# Patient Record
Sex: Male | Born: 1980 | Race: White | Hispanic: No | Marital: Married | Smoking: Former smoker
Health system: Southern US, Community
[De-identification: ages and names within clinical notes are randomized; demographics above are authoritative.]

## PROBLEM LIST (undated history)

## (undated) DIAGNOSIS — N189 Chronic kidney disease, unspecified: Secondary | ICD-10-CM

## (undated) DIAGNOSIS — I429 Cardiomyopathy, unspecified: Secondary | ICD-10-CM

## (undated) DIAGNOSIS — I5042 Chronic combined systolic (congestive) and diastolic (congestive) heart failure: Secondary | ICD-10-CM

## (undated) HISTORY — PX: NO PAST SURGERIES: SHX2092

---

## 2020-04-29 DIAGNOSIS — I10 Essential (primary) hypertension: Secondary | ICD-10-CM

## 2020-04-29 HISTORY — DX: Essential (primary) hypertension: I10

## 2020-05-11 ENCOUNTER — Other Ambulatory Visit: Payer: Self-pay

## 2020-05-11 ENCOUNTER — Encounter (HOSPITAL_BASED_OUTPATIENT_CLINIC_OR_DEPARTMENT_OTHER): Payer: Self-pay

## 2020-05-11 ENCOUNTER — Encounter: Payer: Self-pay | Admitting: Emergency Medicine

## 2020-05-11 ENCOUNTER — Ambulatory Visit
Admission: EM | Admit: 2020-05-11 | Discharge: 2020-05-11 | Disposition: A | Discharge status: Home / Self Care | Payer: BC Managed Care – PPO | Attending: Emergency Medicine | Admitting: Emergency Medicine

## 2020-05-11 ENCOUNTER — Ambulatory Visit (INDEPENDENT_AMBULATORY_CARE_PROVIDER_SITE_OTHER): Payer: BC Managed Care – PPO

## 2020-05-11 ENCOUNTER — Inpatient Hospital Stay (HOSPITAL_BASED_OUTPATIENT_CLINIC_OR_DEPARTMENT_OTHER)
Admission: EM | Admit: 2020-05-11 | Discharge: 2020-05-14 | DRG: 304 | Disposition: A | Discharge status: Home / Self Care | Payer: BC Managed Care – PPO | Attending: Internal Medicine | Admitting: Internal Medicine

## 2020-05-11 DIAGNOSIS — Z6833 Body mass index (BMI) 33.0-33.9, adult: Secondary | ICD-10-CM

## 2020-05-11 DIAGNOSIS — I248 Other forms of acute ischemic heart disease: Secondary | ICD-10-CM | POA: Diagnosis present

## 2020-05-11 DIAGNOSIS — Z79899 Other long term (current) drug therapy: Secondary | ICD-10-CM

## 2020-05-11 DIAGNOSIS — E785 Hyperlipidemia, unspecified: Secondary | ICD-10-CM | POA: Diagnosis present

## 2020-05-11 DIAGNOSIS — I5043 Acute on chronic combined systolic (congestive) and diastolic (congestive) heart failure: Secondary | ICD-10-CM | POA: Diagnosis present

## 2020-05-11 DIAGNOSIS — R9431 Abnormal electrocardiogram [ECG] [EKG]: Secondary | ICD-10-CM

## 2020-05-11 DIAGNOSIS — I43 Cardiomyopathy in diseases classified elsewhere: Secondary | ICD-10-CM | POA: Diagnosis present

## 2020-05-11 DIAGNOSIS — I130 Hypertensive heart and chronic kidney disease with heart failure and stage 1 through stage 4 chronic kidney disease, or unspecified chronic kidney disease: Secondary | ICD-10-CM | POA: Diagnosis present

## 2020-05-11 DIAGNOSIS — I509 Heart failure, unspecified: Secondary | ICD-10-CM

## 2020-05-11 DIAGNOSIS — R0981 Nasal congestion: Secondary | ICD-10-CM

## 2020-05-11 DIAGNOSIS — R05 Cough: Secondary | ICD-10-CM

## 2020-05-11 DIAGNOSIS — E876 Hypokalemia: Secondary | ICD-10-CM | POA: Diagnosis present

## 2020-05-11 DIAGNOSIS — I161 Hypertensive emergency: Principal | ICD-10-CM | POA: Diagnosis present

## 2020-05-11 DIAGNOSIS — I169 Hypertensive crisis, unspecified: Secondary | ICD-10-CM | POA: Diagnosis present

## 2020-05-11 DIAGNOSIS — Z87891 Personal history of nicotine dependence: Secondary | ICD-10-CM

## 2020-05-11 DIAGNOSIS — Z20822 Contact with and (suspected) exposure to covid-19: Secondary | ICD-10-CM | POA: Diagnosis present

## 2020-05-11 DIAGNOSIS — R0600 Dyspnea, unspecified: Secondary | ICD-10-CM | POA: Diagnosis not present

## 2020-05-11 DIAGNOSIS — N19 Unspecified kidney failure: Secondary | ICD-10-CM

## 2020-05-11 DIAGNOSIS — R059 Cough, unspecified: Secondary | ICD-10-CM

## 2020-05-11 DIAGNOSIS — R0602 Shortness of breath: Secondary | ICD-10-CM

## 2020-05-11 DIAGNOSIS — N179 Acute kidney failure, unspecified: Secondary | ICD-10-CM | POA: Diagnosis present

## 2020-05-11 DIAGNOSIS — N1832 Chronic kidney disease, stage 3b: Secondary | ICD-10-CM | POA: Diagnosis present

## 2020-05-11 HISTORY — DX: Chronic combined systolic (congestive) and diastolic (congestive) heart failure: I50.42

## 2020-05-11 HISTORY — DX: Chronic kidney disease, unspecified: N18.9

## 2020-05-11 HISTORY — DX: Cardiomyopathy, unspecified: I42.9

## 2020-05-11 LAB — BASIC METABOLIC PANEL
Anion gap: 11 (ref 5–15)
BUN: 30 mg/dL — ABNORMAL HIGH (ref 6–20)
CO2: 24 mmol/L (ref 22–32)
Calcium: 8.9 mg/dL (ref 8.9–10.3)
Chloride: 102 mmol/L (ref 98–111)
Creatinine, Ser: 2.34 mg/dL — ABNORMAL HIGH (ref 0.61–1.24)
GFR calc Af Amer: 39 mL/min — ABNORMAL LOW (ref >60)
GFR calc non Af Amer: 34 mL/min — ABNORMAL LOW (ref >60)
Glucose, Bld: 102 mg/dL — ABNORMAL HIGH (ref 70–99)
Potassium: 3.4 mmol/L — ABNORMAL LOW (ref 3.5–5.1)
Sodium: 137 mmol/L (ref 135–145)

## 2020-05-11 LAB — CBC WITH DIFFERENTIAL/PLATELET
Abs Immature Granulocytes: 0.06 10*3/uL (ref 0.00–0.07)
Basophils Absolute: 0.1 10*3/uL (ref 0.0–0.1)
Basophils Relative: 1 %
Eosinophils Absolute: 0.4 10*3/uL (ref 0.0–0.5)
Eosinophils Relative: 4 %
HCT: 42.3 % (ref 39.0–52.0)
Hemoglobin: 15 g/dL (ref 13.0–17.0)
Immature Granulocytes: 1 %
Lymphocytes Relative: 21 %
Lymphs Abs: 1.9 10*3/uL (ref 0.7–4.0)
MCH: 32.4 pg (ref 26.0–34.0)
MCHC: 35.5 g/dL (ref 30.0–36.0)
MCV: 91.4 fL (ref 80.0–100.0)
Monocytes Absolute: 0.6 10*3/uL (ref 0.1–1.0)
Monocytes Relative: 7 %
Neutro Abs: 5.8 10*3/uL (ref 1.7–7.7)
Neutrophils Relative %: 66 %
Platelets: 102 10*3/uL — ABNORMAL LOW (ref 150–400)
RBC: 4.63 MIL/uL (ref 4.22–5.81)
RDW: 14.9 % (ref 11.5–15.5)
WBC: 8.8 10*3/uL (ref 4.0–10.5)
nRBC: 0 % (ref 0.0–0.2)

## 2020-05-11 LAB — BRAIN NATRIURETIC PEPTIDE: B Natriuretic Peptide: 396.7 pg/mL — ABNORMAL HIGH (ref 0.0–100.0)

## 2020-05-11 LAB — SARS CORONAVIRUS 2 BY RT PCR (HOSPITAL ORDER, PERFORMED IN ~~LOC~~ HOSPITAL LAB): SARS Coronavirus 2: NEGATIVE

## 2020-05-11 MED ORDER — HYDRALAZINE HCL 25 MG PO TABS
25.0000 mg | ORAL_TABLET | Freq: Once | ORAL | Status: AC
  Administered 2020-05-11: 25 mg via ORAL
  Filled 2020-05-11: qty 1

## 2020-05-11 MED ORDER — AMLODIPINE BESYLATE 5 MG PO TABS
10.0000 mg | ORAL_TABLET | Freq: Once | ORAL | Status: AC
  Administered 2020-05-11: 10 mg via ORAL
  Filled 2020-05-11: qty 2

## 2020-05-11 MED ORDER — LABETALOL HCL 5 MG/ML IV SOLN
10.0000 mg | Freq: Once | INTRAVENOUS | Status: AC
  Administered 2020-05-11: 10 mg via INTRAVENOUS
  Filled 2020-05-11: qty 4

## 2020-05-11 NOTE — ED Triage Notes (Addendum)
Nasal congestion x 2 weeks, OTC meds not helping. Fully vaccinated.

## 2020-05-11 NOTE — ED Notes (Signed)
Case discussed with EDP Lynelle Doctor after his review of EKG-orders received

## 2020-05-11 NOTE — ED Triage Notes (Signed)
Pt was seen at [facility] for flu sx x 2 weeks-was sent to ED for "high BP and abnormal EKG"-pt denies pain-states a covid test was done and is pending-NAD-steady gait

## 2020-05-11 NOTE — ED Provider Notes (Signed)
[facility]    CSN: [id number] Arrival date & time: 05/11/20  1306      History   Chief Complaint Chief Complaint  Patient presents with  . Nasal Congestion    HPI Sean Charles is a 39 y.o. male  Subjective:   Sean Charles is a 39 y.o. male here for evaluation of a cough.  The cough is non-productive, with shortness of breath during the cough and is aggravated by nothing. Onset of symptoms was 2 weeks ago, unchanged since that time.  Associated symptoms include nasal congestion. Patient does not have a history of asthma. Patient has not had recent travel. Patient does not have a history of smoking. Patient  has not had a previous chest x-ray. Patient has not had a PPD done.  Taking decongestants w/o relief.   BP elevated: no CP, leg swelling, nausea, facial droop, numbness/weakness.  No known h/o HTN.  The following portions of the patient's history were reviewed and updated as appropriate: allergies, current medications, past family history, past medical history, past social history, past surgical history and problem list.     History reviewed. No pertinent past medical history.  There are no problems to display for this patient.   History reviewed. No pertinent surgical history.     Home Medications    Prior to Admission medications   Not on File    Family History History reviewed. No pertinent family history.  Social History Social History   Tobacco Use  . Smoking status: Never Smoker  . Smokeless tobacco: Never Used  Vaping Use  . Vaping Use: Never used  Substance Use Topics  . Alcohol use: Not on file  . Drug use: Not on file     Allergies   Patient has no known allergies.   Review of Systems As per HPI   Physical Exam Triage Vital Signs ED Triage Vitals [05/11/20 1311]  Enc Vitals Group     BP      Pulse Rate (!) 106     Resp 16     Temp 98.9 F (37.2 C)     Temp src      SpO2 99 %     Weight      Height      Head  Circumference      Peak Flow      Pain Score      Pain Loc      Pain Edu?      Excl. in GC?    No data found.  Updated Vital Signs BP (!) 223/148   Pulse (!) 106   Temp 98.9 F (37.2 C)   Resp 16   SpO2 99%   Visual Acuity Right Eye Distance:   Left Eye Distance:   Bilateral Distance:    Right Eye Near:   Left Eye Near:    Bilateral Near:     Physical Exam Constitutional:      General: He is not in acute distress.    Appearance: He is not toxic-appearing or diaphoretic.  HENT:     Head: Normocephalic and atraumatic.     Right Ear: Tympanic membrane, ear canal and external ear normal.     Left Ear: Tympanic membrane, ear canal and external ear normal.     Nose: Nose normal.     Mouth/Throat:     Mouth: Mucous membranes are moist.     Pharynx: Oropharynx is clear.  Eyes:     General: No scleral icterus.  Conjunctiva/sclera: Conjunctivae normal.     Pupils: Pupils are equal, round, and reactive to light.  Neck:     Comments: Trachea midline, negative JVD Cardiovascular:     Rate and Rhythm: Regular rhythm. Tachycardia present.     Comments: BP 240/161 at bedside Pulmonary:     Effort: Pulmonary effort is normal. No respiratory distress.     Breath sounds: No wheezing, rhonchi or rales.  Musculoskeletal:     Cervical back: Neck supple. No tenderness.  Lymphadenopathy:     Cervical: No cervical adenopathy.  Skin:    Capillary Refill: Capillary refill takes less than 2 seconds.     Coloration: Skin is not jaundiced or pale.     Findings: No rash.  Neurological:     Mental Status: He is alert and oriented to person, place, and time.      UC Treatments / Results  Labs (all labs ordered are listed, but only abnormal results are displayed) Labs Reviewed  NOVEL CORONAVIRUS, NAA    EKG   Radiology DG Chest 2 View  Result Date: 05/11/2020 CLINICAL DATA:  Cough. Nasal congestion. Shortness of breath. Two weeks duration. EXAM: CHEST - 2 VIEW COMPARISON:   None. FINDINGS: Heart size upper limits of normal. Mildly tortuous aorta. No consolidation, collapse or effusion. Possible bronchial thickening suggesting bronchitis. No significant bone finding. IMPRESSION: Possible bronchitis pattern. No consolidation or collapse. Electronically Signed   By: Paulina Fusi M.D.   On: 05/11/2020 13:49    Procedures Procedures (including critical care time)  Medications Ordered in UC Medications - No data to display  Initial Impression / Assessment and Plan / UC Course  I have reviewed the triage vital signs and the nursing notes.  Pertinent labs & imaging results that were available during my care of the patient were reviewed by me and considered in my medical decision making (see chart for details).     Patient afebrile, nontoxic, with SpO2 99%.  EKG done in office, reviewed by me w/o previous to compare.  Sinus tachycardia with ventricular at 104 bpm.  Patient does have QTC prolongation with left ventricular hypertrophy and repolarization abnormality.  CXR with possible bronchitis pattern and heart size upper limit of normal.  No consolidation or collapse.  Covid PCR pending.  Decongestants which could contribute to BP elevation.  Given patient's significantly elevated blood pressure, abnormal EKG, and lack of access to routine outpatient health care, patient referred to ER for further evaluation/management of hypertensive emergency.  Patient electing to transport to ER in personal vehicle. Final Clinical Impressions(s) / UC Diagnoses   Final diagnoses:  Cough  Nasal congestion  Hypertensive emergency  Dyspnea, unspecified type  Abnormal EKG   Discharge Instructions   None    ED Prescriptions    None     PDMP not reviewed this encounter.   Hall-Potvin, [place], [place] 05/11/20 1417

## 2020-05-11 NOTE — ED Provider Notes (Signed)
[facility] EMERGENCY DEPARTMENT Provider Note   CSN: [id number] Arrival date & time: 05/11/20  1436     History Chief Complaint  Patient presents with  . Hypertension    Sean Charles is a 39 y.o. male.  HPI     2wk ago coughing a lot, dyspnea Felt better for about a week then began to feel badly again on Tuesday, increasing cough and shortness of breath. Not coughing anything up.  Still feeling some short of breath, sitting up seems to help, tries to sleep more propped up, breaths better. Dyspnea. No leg swelling. Waking up feeling dyspnea at night.  No chest pain Denies numbness, weakness, difficulty talking or walking, visual changes or facial droop.    Feels like chest congestion, but not having runny nose/sinus congestion/sore throat, no sick contacts at home  Fatigue over the last week  Started taking a sleep aid Working from home today and went to get checked out for the coughing  Checked blood pressure and found it to be elevated at urgent care, had CXR which showed enlarged heart   No known history of hypertension, does not see physician regularly  Family hx-CAD in family, uncle, mom had some htn   Does not take any medications  Used to smoke, occ etoh once/day, no other drugs  Takes ibuprofen-2-3times/week, robitussen over last few weeks   History reviewed. No pertinent past medical history.  Patient Active Problem List   Diagnosis Date Noted  . AKI (acute kidney injury) (HCC) 05/12/2020  . Acute on chronic congestive heart failure (HCC) 05/12/2020  . Hypertensive crisis 05/11/2020    History reviewed. No pertinent surgical history.     Family History  Problem Relation Age of Onset  . Heart disease Maternal Uncle     Social History   Tobacco Use  . Smoking status: Former Smoker    Quit date: 2016    Years since quitting: 5.6  . Smokeless tobacco: Never Used  Vaping Use  . Vaping Use: Never used  Substance Use Topics  .  Alcohol use: Yes    Alcohol/week: 7.0 standard drinks    Types: 7 Cans of beer per week    Comment: daily  . Drug use: Never    Home Medications Prior to Admission medications   Not on File    Allergies    Patient has no known allergies.  Review of Systems   Review of Systems  Constitutional: Positive for fatigue. Negative for fever.  HENT: Negative for congestion and sore throat.   Eyes: Negative for visual disturbance.  Respiratory: Positive for cough and shortness of breath.   Cardiovascular: Negative for chest pain.  Gastrointestinal: Negative for abdominal pain, nausea and vomiting.  Genitourinary: Negative for difficulty urinating.  Musculoskeletal: Negative for back pain, neck pain and neck stiffness.  Skin: Negative for rash.  Neurological: Negative for dizziness, syncope, facial asymmetry, weakness, numbness and headaches.    Physical Exam Updated Vital Signs BP (!) 179/131 (BP Location: Left Arm)   Pulse 92   Temp 97.8 F (36.6 C) (Skin)   Resp 19   Ht 5\' 10"  (1.778 m)   Wt 107 kg   SpO2 98%   BMI 33.85 kg/m   Physical Exam Vitals and nursing note reviewed.  Constitutional:      General: He is not in acute distress.    Appearance: He is well-developed. He is not diaphoretic.  HENT:     Head: Normocephalic and atraumatic.  Eyes:  Conjunctiva/sclera: Conjunctivae normal.  Cardiovascular:     Rate and Rhythm: Normal rate and regular rhythm.     Heart sounds: Normal heart sounds. No murmur heard.  No friction rub. No gallop.   Pulmonary:     Effort: Pulmonary effort is normal. No respiratory distress.     Breath sounds: Normal breath sounds. No wheezing or rales.  Abdominal:     General: There is no distension.     Palpations: Abdomen is soft.     Tenderness: There is no abdominal tenderness. There is no guarding.  Musculoskeletal:     Cervical back: Normal range of motion.  Skin:    General: Skin is warm and dry.  Neurological:     Mental  Status: He is alert and oriented to person, place, and time.     ED Results / Procedures / Treatments   Labs (all labs ordered are listed, but only abnormal results are displayed) Labs Reviewed  CBC WITH DIFFERENTIAL/PLATELET - Abnormal; Notable for the following components:      Result Value   Platelets 102 (*)    All other components within normal limits  BASIC METABOLIC PANEL - Abnormal; Notable for the following components:   Potassium 3.4 (*)    Glucose, Bld 102 (*)    BUN 30 (*)    Creatinine, Ser 2.34 (*)    GFR calc non Af Amer 34 (*)    GFR calc Af Amer 39 (*)    All other components within normal limits  BRAIN NATRIURETIC PEPTIDE - Abnormal; Notable for the following components:   B Natriuretic Peptide 396.7 (*)    All other components within normal limits  URINALYSIS, COMPLETE (UACMP) WITH MICROSCOPIC - Abnormal; Notable for the following components:   Color, Urine STRAW (*)    Hgb urine dipstick SMALL (*)    Protein, ur 30 (*)    All other components within normal limits  COMPREHENSIVE METABOLIC PANEL - Abnormal; Notable for the following components:   Potassium 2.8 (*)    Glucose, Bld 119 (*)    BUN 23 (*)    Creatinine, Ser 2.33 (*)    Total Protein 6.4 (*)    ALT 87 (*)    Total Bilirubin 1.5 (*)    GFR calc non Af Amer 34 (*)    GFR calc Af Amer 39 (*)    All other components within normal limits  CBC WITH DIFFERENTIAL/PLATELET - Abnormal; Notable for the following components:   MCHC 36.2 (*)    Platelets 97 (*)    All other components within normal limits  TROPONIN I (HIGH SENSITIVITY) - Abnormal; Notable for the following components:   Troponin I (High Sensitivity) 38 (*)    All other components within normal limits  SARS CORONAVIRUS 2 BY RT PCR (HOSPITAL ORDER, PERFORMED IN [facility])  SODIUM, URINE, RANDOM  CREATININE, URINE, RANDOM  MAGNESIUM  HIV ANTIBODY (ROUTINE TESTING W REFLEX)  UREA NITROGEN, URINE     EKG None  Radiology DG Chest 2 View  Result Date: 05/11/2020 CLINICAL DATA:  Cough. Nasal congestion. Shortness of breath. Two weeks duration. EXAM: CHEST - 2 VIEW COMPARISON:  None. FINDINGS: Heart size upper limits of normal. Mildly tortuous aorta. No consolidation, collapse or effusion. Possible bronchial thickening suggesting bronchitis. No significant bone finding. IMPRESSION: Possible bronchitis pattern. No consolidation or collapse. Electronically Signed   By: Paulina Fusi M.D.   On: 05/11/2020 13:49   US RENAL  Result Date: 05/12/2020  CLINICAL DATA:  Initial evaluation for acute renal failure. EXAM: RENAL / URINARY TRACT ULTRASOUND COMPLETE COMPARISON:  None. FINDINGS: Right Kidney: Renal measurements: 12.3 x 4.2 x 4.4 cm = volume: 119.3 mL. Renal echogenicity within normal limits. No nephrolithiasis or hydronephrosis. No focal renal mass. Left Kidney: Renal measurements: 12.5 x 4.6 x 4.7 cm = volume: 142.0 mL. Renal echogenicity within normal limits. No nephrolithiasis or hydronephrosis. No focal renal mass. Bladder: Appears normal for degree of bladder distention. Other: None. IMPRESSION: Normal renal ultrasound. No hydronephrosis or other acute abnormality. Electronically Signed   By: Rise Mu M.D.   On: 05/12/2020 07:02    Procedures Procedures (including critical care time)  Medications Ordered in ED Medications  hydrALAZINE (APRESOLINE) injection 10 mg (10 mg Intravenous Given 05/12/20 0832)  carvedilol (COREG) tablet 6.25 mg (6.25 mg Oral Given 05/12/20 0829)  furosemide (LASIX) injection 40 mg (40 mg Intravenous Given 05/12/20 0831)  sodium chloride flush (NS) 0.9 % injection 3 mL (3 mLs Intravenous Given 05/12/20 0836)  sodium chloride flush (NS) 0.9 % injection 3 mL (has no administration in time range)  0.9 %  sodium chloride infusion (has no administration in time range)  acetaminophen (TYLENOL) tablet 650 mg (has no administration in time range)   ondansetron (ZOFRAN) injection 4 mg (has no administration in time range)  enoxaparin (LOVENOX) injection 40 mg (40 mg Subcutaneous Given 05/12/20 0830)  polyethylene glycol (MIRALAX / GLYCOLAX) packet 17 g (has no administration in time range)  amLODipine (NORVASC) tablet 10 mg (10 mg Oral Given 05/12/20 0829)  potassium chloride SA (KLOR-CON) CR tablet 40 mEq (40 mEq Oral Given 05/12/20 0826)  hydrALAZINE (APRESOLINE) tablet 25 mg (has no administration in time range)  amLODipine (NORVASC) tablet 10 mg (10 mg Oral Given 05/11/20 1539)  hydrALAZINE (APRESOLINE) tablet 25 mg (25 mg Oral Given 05/11/20 1750)  labetalol (NORMODYNE) injection 10 mg (10 mg Intravenous Given 05/11/20 2042)    ED Course  I have reviewed the triage vital signs and the nursing notes.  Pertinent labs & imaging results that were available during my care of the patient were reviewed by me and considered in my medical decision making (see chart for details).    MDM Rules/Calculators/A&P                          39yo male with no known medical history presents with concern for dyspnea, cough and hypertension found at urgent care. CXR and EKG done at urgent care show no pulmonary edema, heart upper limits of normal in size, EKG with LVH.  Blood pressures 223/148.  Reports cough, congestion, no signs of significant CHF on XR but concern for element of this given symptoms and severe hypertension.  Creatinine 2.3 with no known prior, unclear acuity. Suspect renal injury secondary to hypertensive urgency/emergency.  Given oral medication without significant change in blood pressures. Given IV labetalol with and will admit for renal failure and hypertensive emergency.    Final Clinical Impression(s) / ED Diagnoses Final diagnoses:  Hypertensive emergency  Acute renal failure, unspecified acute renal failure type [facility])    Rx / DC Orders ED Discharge Orders    None       Alvira Monday, MD 05/12/20 1008

## 2020-05-11 NOTE — Care Management (Addendum)
Received consult for assistance with finding a PCP patient has been to Ugent Care at [facility], will send information to [facility] for follow up. Will have Clinic CM follow up with patient,all information placed on AVS.

## 2020-05-11 NOTE — ED Notes (Signed)
Patient is being discharged from the Urgent Care and sent to the Emergency Department via POV after declining EMS . Per [place], [place], patient is in need of higher level of care due to hypertension. Patient is aware and verbalizes understanding of plan of care.  Vitals:   05/11/20 1311 05/11/20 1315  BP:  (!) 223/148  Pulse: (!) 106   Resp: 16   Temp: 98.9 F (37.2 C)   SpO2: 99%

## 2020-05-12 ENCOUNTER — Inpatient Hospital Stay (HOSPITAL_COMMUNITY): Payer: BC Managed Care – PPO

## 2020-05-12 ENCOUNTER — Encounter (HOSPITAL_COMMUNITY): Payer: Self-pay | Admitting: Family Medicine

## 2020-05-12 ENCOUNTER — Observation Stay (HOSPITAL_COMMUNITY): Payer: BC Managed Care – PPO

## 2020-05-12 DIAGNOSIS — N179 Acute kidney failure, unspecified: Secondary | ICD-10-CM | POA: Diagnosis present

## 2020-05-12 DIAGNOSIS — I509 Heart failure, unspecified: Secondary | ICD-10-CM | POA: Diagnosis not present

## 2020-05-12 DIAGNOSIS — N1832 Chronic kidney disease, stage 3b: Secondary | ICD-10-CM | POA: Diagnosis present

## 2020-05-12 DIAGNOSIS — Z6833 Body mass index (BMI) 33.0-33.9, adult: Secondary | ICD-10-CM | POA: Diagnosis not present

## 2020-05-12 DIAGNOSIS — I160 Hypertensive urgency: Secondary | ICD-10-CM | POA: Diagnosis not present

## 2020-05-12 DIAGNOSIS — Z20822 Contact with and (suspected) exposure to covid-19: Secondary | ICD-10-CM | POA: Diagnosis present

## 2020-05-12 DIAGNOSIS — I248 Other forms of acute ischemic heart disease: Secondary | ICD-10-CM | POA: Diagnosis present

## 2020-05-12 DIAGNOSIS — I5021 Acute systolic (congestive) heart failure: Secondary | ICD-10-CM | POA: Diagnosis not present

## 2020-05-12 DIAGNOSIS — E876 Hypokalemia: Secondary | ICD-10-CM | POA: Diagnosis present

## 2020-05-12 DIAGNOSIS — E785 Hyperlipidemia, unspecified: Secondary | ICD-10-CM | POA: Diagnosis present

## 2020-05-12 DIAGNOSIS — Z79899 Other long term (current) drug therapy: Secondary | ICD-10-CM | POA: Diagnosis not present

## 2020-05-12 DIAGNOSIS — Z87891 Personal history of nicotine dependence: Secondary | ICD-10-CM | POA: Diagnosis not present

## 2020-05-12 DIAGNOSIS — I161 Hypertensive emergency: Secondary | ICD-10-CM | POA: Diagnosis present

## 2020-05-12 DIAGNOSIS — I5041 Acute combined systolic (congestive) and diastolic (congestive) heart failure: Secondary | ICD-10-CM | POA: Diagnosis not present

## 2020-05-12 DIAGNOSIS — I130 Hypertensive heart and chronic kidney disease with heart failure and stage 1 through stage 4 chronic kidney disease, or unspecified chronic kidney disease: Secondary | ICD-10-CM | POA: Diagnosis present

## 2020-05-12 DIAGNOSIS — I5043 Acute on chronic combined systolic (congestive) and diastolic (congestive) heart failure: Secondary | ICD-10-CM | POA: Diagnosis present

## 2020-05-12 DIAGNOSIS — I43 Cardiomyopathy in diseases classified elsewhere: Secondary | ICD-10-CM | POA: Diagnosis present

## 2020-05-12 DIAGNOSIS — I169 Hypertensive crisis, unspecified: Secondary | ICD-10-CM | POA: Diagnosis not present

## 2020-05-12 LAB — CBC WITH DIFFERENTIAL/PLATELET
Abs Immature Granulocytes: 0.06 10*3/uL (ref 0.00–0.07)
Basophils Absolute: 0.1 10*3/uL (ref 0.0–0.1)
Basophils Relative: 1 %
Eosinophils Absolute: 0.3 10*3/uL (ref 0.0–0.5)
Eosinophils Relative: 4 %
HCT: 42 % (ref 39.0–52.0)
Hemoglobin: 15.2 g/dL (ref 13.0–17.0)
Immature Granulocytes: 1 %
Lymphocytes Relative: 23 %
Lymphs Abs: 2.1 10*3/uL (ref 0.7–4.0)
MCH: 32.5 pg (ref 26.0–34.0)
MCHC: 36.2 g/dL — ABNORMAL HIGH (ref 30.0–36.0)
MCV: 89.7 fL (ref 80.0–100.0)
Monocytes Absolute: 0.7 10*3/uL (ref 0.1–1.0)
Monocytes Relative: 7 %
Neutro Abs: 6.1 10*3/uL (ref 1.7–7.7)
Neutrophils Relative %: 64 %
Platelets: 97 10*3/uL — ABNORMAL LOW (ref 150–400)
RBC: 4.68 MIL/uL (ref 4.22–5.81)
RDW: 14.6 % (ref 11.5–15.5)
WBC: 9.4 10*3/uL (ref 4.0–10.5)
nRBC: 0 % (ref 0.0–0.2)

## 2020-05-12 LAB — COMPREHENSIVE METABOLIC PANEL
ALT: 87 U/L — ABNORMAL HIGH (ref 0–44)
AST: 40 U/L (ref 15–41)
Albumin: 3.5 g/dL (ref 3.5–5.0)
Alkaline Phosphatase: 67 U/L (ref 38–126)
Anion gap: 11 (ref 5–15)
BUN: 23 mg/dL — ABNORMAL HIGH (ref 6–20)
CO2: 23 mmol/L (ref 22–32)
Calcium: 8.9 mg/dL (ref 8.9–10.3)
Chloride: 101 mmol/L (ref 98–111)
Creatinine, Ser: 2.33 mg/dL — ABNORMAL HIGH (ref 0.61–1.24)
GFR calc Af Amer: 39 mL/min — ABNORMAL LOW (ref >60)
GFR calc non Af Amer: 34 mL/min — ABNORMAL LOW (ref >60)
Glucose, Bld: 119 mg/dL — ABNORMAL HIGH (ref 70–99)
Potassium: 2.8 mmol/L — ABNORMAL LOW (ref 3.5–5.1)
Sodium: 135 mmol/L (ref 135–145)
Total Bilirubin: 1.5 mg/dL — ABNORMAL HIGH (ref 0.3–1.2)
Total Protein: 6.4 g/dL — ABNORMAL LOW (ref 6.5–8.1)

## 2020-05-12 LAB — ECHOCARDIOGRAM COMPLETE
Area-P 1/2: 5.5 cm2
Calc EF: 46.7 %
Height: 70 in
S' Lateral: 3.9 cm
Single Plane A2C EF: 47.9 %
Single Plane A4C EF: 46.6 %
Weight: 3774.28 oz

## 2020-05-12 LAB — URINALYSIS, COMPLETE (UACMP) WITH MICROSCOPIC
Bacteria, UA: NONE SEEN
Bilirubin Urine: NEGATIVE
Glucose, UA: NEGATIVE mg/dL
Ketones, ur: NEGATIVE mg/dL
Leukocytes,Ua: NEGATIVE
Nitrite: NEGATIVE
Protein, ur: 30 mg/dL — AB
Specific Gravity, Urine: 1.009 (ref 1.005–1.030)
pH: 7 (ref 5.0–8.0)

## 2020-05-12 LAB — HIV ANTIBODY (ROUTINE TESTING W REFLEX): HIV Screen 4th Generation wRfx: NONREACTIVE

## 2020-05-12 LAB — TROPONIN I (HIGH SENSITIVITY)
Troponin I (High Sensitivity): 38 ng/L — ABNORMAL HIGH (ref <18)
Troponin I (High Sensitivity): 36 ng/L — ABNORMAL HIGH (ref <18)

## 2020-05-12 LAB — CREATININE, URINE, RANDOM: Creatinine, Urine: 45 mg/dL

## 2020-05-12 LAB — NOVEL CORONAVIRUS, NAA: SARS-CoV-2, NAA: NOT DETECTED

## 2020-05-12 LAB — SODIUM, URINE, RANDOM: Sodium, Ur: 140 mmol/L

## 2020-05-12 LAB — SARS-COV-2, NAA 2 DAY TAT

## 2020-05-12 LAB — MAGNESIUM: Magnesium: 2.1 mg/dL (ref 1.7–2.4)

## 2020-05-12 MED ORDER — FUROSEMIDE 10 MG/ML IJ SOLN
40.0000 mg | Freq: Two times a day (BID) | INTRAMUSCULAR | Status: DC
  Administered 2020-05-12 (×3): 40 mg via INTRAVENOUS
  Filled 2020-05-12 (×3): qty 4

## 2020-05-12 MED ORDER — CARVEDILOL 6.25 MG PO TABS
6.2500 mg | ORAL_TABLET | Freq: Two times a day (BID) | ORAL | Status: DC
  Administered 2020-05-12 (×2): 6.25 mg via ORAL
  Filled 2020-05-12 (×2): qty 1

## 2020-05-12 MED ORDER — HYDRALAZINE HCL 25 MG PO TABS: 25.0000 mg | ORAL_TABLET | Freq: Three times a day (TID) | ORAL | Status: DC

## 2020-05-12 MED ORDER — HYDRALAZINE HCL 25 MG PO TABS
25.0000 mg | ORAL_TABLET | Freq: Three times a day (TID) | ORAL | Status: DC
  Administered 2020-05-12 – 2020-05-13 (×2): 25 mg via ORAL
  Filled 2020-05-12 (×2): qty 1

## 2020-05-12 MED ORDER — AMLODIPINE BESYLATE 10 MG PO TABS
10.0000 mg | ORAL_TABLET | Freq: Every day | ORAL | Status: DC
  Administered 2020-05-12 – 2020-05-14 (×3): 10 mg via ORAL
  Filled 2020-05-12 (×3): qty 1

## 2020-05-12 MED ORDER — CARVEDILOL 6.25 MG PO TABS: 6.2500 mg | ORAL_TABLET | Freq: Two times a day (BID) | ORAL | Status: DC

## 2020-05-12 MED ORDER — SODIUM CHLORIDE 0.9 % IV SOLN: 250.0000 mL | INTRAVENOUS | Status: DC | PRN

## 2020-05-12 MED ORDER — POLYETHYLENE GLYCOL 3350 17 G PO PACK: 17.0000 g | PACK | Freq: Every day | ORAL | Status: DC | PRN

## 2020-05-12 MED ORDER — ONDANSETRON HCL 4 MG/2ML IJ SOLN
4.0000 mg | Freq: Four times a day (QID) | INTRAMUSCULAR | Status: DC | PRN
  Administered 2020-05-12: 4 mg via INTRAVENOUS
  Filled 2020-05-12: qty 2

## 2020-05-12 MED ORDER — ENOXAPARIN SODIUM 40 MG/0.4ML ~~LOC~~ SOLN
40.0000 mg | Freq: Every day | SUBCUTANEOUS | Status: DC
  Administered 2020-05-12 – 2020-05-14 (×2): 40 mg via SUBCUTANEOUS
  Filled 2020-05-12 (×3): qty 0.4

## 2020-05-12 MED ORDER — SODIUM CHLORIDE 0.9% FLUSH: 3.0000 mL | INTRAVENOUS | Status: DC | PRN

## 2020-05-12 MED ORDER — POTASSIUM CHLORIDE CRYS ER 20 MEQ PO TBCR
40.0000 meq | EXTENDED_RELEASE_TABLET | ORAL | Status: AC
  Administered 2020-05-12 (×3): 40 meq via ORAL
  Filled 2020-05-12 (×3): qty 2

## 2020-05-12 MED ORDER — MELATONIN 3 MG PO TABS
3.0000 mg | ORAL_TABLET | Freq: Every evening | ORAL | Status: DC | PRN
  Administered 2020-05-13 (×2): 3 mg via ORAL
  Filled 2020-05-12 (×2): qty 1

## 2020-05-12 MED ORDER — SODIUM CHLORIDE 0.9% FLUSH
3.0000 mL | Freq: Two times a day (BID) | INTRAVENOUS | Status: DC
  Administered 2020-05-12 – 2020-05-14 (×5): 3 mL via INTRAVENOUS

## 2020-05-12 MED ORDER — ACETAMINOPHEN 325 MG PO TABS
650.0000 mg | ORAL_TABLET | ORAL | Status: DC | PRN
  Administered 2020-05-12 – 2020-05-13 (×3): 650 mg via ORAL
  Filled 2020-05-12 (×3): qty 2

## 2020-05-12 MED ORDER — HYDRALAZINE HCL 20 MG/ML IJ SOLN
10.0000 mg | Freq: Four times a day (QID) | INTRAMUSCULAR | Status: DC | PRN
  Administered 2020-05-12 – 2020-05-13 (×3): 10 mg via INTRAVENOUS
  Filled 2020-05-12 (×4): qty 1

## 2020-05-12 MED ORDER — CARVEDILOL 12.5 MG PO TABS
12.5000 mg | ORAL_TABLET | Freq: Two times a day (BID) | ORAL | Status: DC
  Administered 2020-05-13: 12.5 mg via ORAL
  Filled 2020-05-12: qty 1

## 2020-05-12 NOTE — H&P (Signed)
History and Physical    Sean Charles HYW:[id number] DOB: 07/23/1981 DOA: 05/11/2020  PCP: Patient, No Pcp Per  [facility]    Chief Complaint:   Cough, SOB  HPI:    39 year old male without any past medical history who presents to med [facility] emergency department with cough and exertional dyspnea.  Patient explains that approximately 1 month ago he noticed that he was beginning to develop a degree of exertional dyspnea.  He particularly noticed this when he was walking to the store or walking his dog where he would suddenly become short of breath where he was not short of breath before.  Shortness of breath continue to gradually worsening over the following weeks.  Approximately 2 weeks ago the patient began to develop "congestion" with a dry nonproductive cough.  This was associated with episodes of paroxysmal nocturnal dyspnea and pillow orthopnea.  Over the same span of time the patient felt that he was gaining weight but is unable to quantify how much.  Patient denies any associated leg swelling, chest pain, fever, increasing abdominal girth, sick contacts or contacts with confirmed COVID-19 infection.  Of note, patient has received his COVID-19 vaccine in early 2021.  Patient dyspnea on exertion and cough continued to worsen until he eventually presented to [facility] for evaluation.  Upon evaluation at [facility] the patient was found to have markedly elevated blood pressures as high as 229/167 on arrival.  Patient was provided a dose of amlodipine as well as intravenous labetalol.  Patient was also found to have an elevated creatinine of 2.34 as well as an elevated BNP of 396.  Patient is felt to be suffering from hypertensive crisis and therefore the hospitalist group has been called to accept the patient as a transfer to the progressive medical floor at [facility] for admission.  Review of Systems:   Review of  Systems  Constitutional: Positive for malaise/fatigue.  Respiratory: Positive for cough and shortness of breath.   Cardiovascular: Positive for PND.  All other systems reviewed and are negative.     History reviewed. No pertinent past medical history.  History reviewed. No pertinent surgical history.   reports that he quit smoking about 5 years ago. He has never used smokeless tobacco. He reports current alcohol use of about 7.0 standard drinks of alcohol per week. He reports that he does not use drugs.  No Known Allergies  Family History  Problem Relation Age of Onset  . Heart disease Maternal Uncle      Prior to Admission medications   Not on File    Physical Exam: Vitals:   05/11/20 2200 05/11/20 2342 05/12/20 0012 05/12/20 0211  BP: (!) 190/133 (!) 212/140 (!) 187/134 (!) 178/130  Pulse: 88 99 96   Resp: (!) 34 (!) 25 (!) 21 15  Temp:  98.6 F (37 C)    TempSrc:  Oral    SpO2: 97% 98% 97%   Weight:  107.6 kg    Height:        Constitutional: Acute alert and oriented x3, no associated distress.  Patient is obese. Skin: no rashes, no lesions, good skin turgor noted. Eyes: Pupils are equally reactive to light.  No evidence of scleral icterus or conjunctival pallor.  ENMT: Moist mucous membranes noted.  Posterior pharynx clear of any exudate or lesions.   Neck: normal, supple, no masses, no thyromegaly.  No evidence of jugular venous distension.  Respiratory: Bibasilar rales noted, clear to auscultation bilaterally, no wheezing,  Normal respiratory effort. No accessory muscle use.  Cardiovascular: Regular rate and rhythm, no murmurs / rubs / gallops. No extremity edema. 2+ pedal pulses. No carotid bruits.  Chest:   Nontender without crepitus or deformity.   Back:   Nontender without crepitus or deformity. Abdomen: Abdomen is soft and nontender.  No evidence of intra-abdominal masses.  Positive bowel sounds noted in all quadrants.   Musculoskeletal: No joint  deformity upper and lower extremities. Good ROM, no contractures. Normal muscle tone.  Neurologic: CN 2-12 grossly intact. Sensation intact, strength noted to be 5 out of 5 in all 4 extremities.  Patient is following all commands.  Patient is responsive to verbal stimuli.   Psychiatric: Patient presents as a normal mood with appropriate affect.  Patient seems to possess insight as to theircurrent situation.     Labs on Admission: I have personally reviewed following labs and imaging studies -   CBC: Recent Labs  Lab 05/11/20 1534  WBC 8.8  NEUTROABS 5.8  HGB 15.0  HCT 42.3  MCV 91.4  PLT 102*   Basic Metabolic Panel: Recent Labs  Lab 05/11/20 1534  NA 137  K 3.4*  CL 102  CO2 24  GLUCOSE 102*  BUN 30*  CREATININE 2.34*  CALCIUM 8.9   GFR: Estimated Creatinine Clearance: 52 mL/min (A) (by C-G formula based on SCr of 2.34 mg/dL (H)). Liver Function Tests: No results for input(s): AST, ALT, ALKPHOS, BILITOT, PROT, ALBUMIN in the last 168 hours. No results for input(s): LIPASE, AMYLASE in the last 168 hours. No results for input(s): AMMONIA in the last 168 hours. Coagulation Profile: No results for input(s): INR, PROTIME in the last 168 hours. Cardiac Enzymes: No results for input(s): CKTOTAL, CKMB, CKMBINDEX, TROPONINI in the last 168 hours. BNP (last 3 results) No results for input(s): PROBNP in the last 8760 hours. HbA1C: No results for input(s): HGBA1C in the last 72 hours. CBG: No results for input(s): GLUCAP in the last 168 hours. Lipid Profile: No results for input(s): CHOL, HDL, LDLCALC, TRIG, CHOLHDL, LDLDIRECT in the last 72 hours. Thyroid Function Tests: No results for input(s): TSH, T4TOTAL, FREET4, T3FREE, THYROIDAB in the last 72 hours. Anemia Panel: No results for input(s): VITAMINB12, FOLATE, FERRITIN, TIBC, IRON, RETICCTPCT in the last 72 hours. Urine analysis: No results found for: COLORURINE, APPEARANCEUR, LABSPEC, PHURINE, GLUCOSEU, HGBUR,  BILIRUBINUR, KETONESUR, PROTEINUR, UROBILINOGEN, NITRITE, LEUKOCYTESUR  Radiological Exams on Admission - Personally Reviewed: DG Chest 2 View  Result Date: 05/11/2020 CLINICAL DATA:  Cough. Nasal congestion. Shortness of breath. Two weeks duration. EXAM: CHEST - 2 VIEW COMPARISON:  None. FINDINGS: Heart size upper limits of normal. Mildly tortuous aorta. No consolidation, collapse or effusion. Possible bronchial thickening suggesting bronchitis. No significant bone finding. IMPRESSION: Possible bronchitis pattern. No consolidation or collapse. Electronically Signed   By: Paulina Fusi M.D.   On: 05/11/2020 13:49    EKG: Personally reviewed.  Rhythm is sinus tachycardia with heart rate of 105 bpm.  Evidence of left ventricular hypertrophy noted.  T wave inversions noted in leads V5 and V6.    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  I am concerned patient has developed mild cardiogenic pulmonary edema secondary to acute heart failure as well as acute kidney injury secondary to these markedly elevated blood pressures.  Patient has been started on scheduled amlodipine this evening and a modest dose of Coreg will be initiated in the morning for slow measured reduction in blood pressures.  Additionally, patient will be provided with as needed dosing of intravenous antihypertensives for excessively elevated blood pressures.  Obtaining echocardiogram in the morning  Monitoring patient on telemetry  Obtaining serial cardiac enzymes  Active Problems:   AKI (acute kidney injury) [facility])   Patient presented with markedly elevated  creatinine of 2.34  Considering patient's presentation with hypertensive crisis I feel that this is most likely the etiology.  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  Strict input and output monitoring  Daily weights  Low-sodium diet  Echocardiogram in the morning    Code Status:  Full code Family Communication: deferred   Status is: Observation  The patient remains OBS appropriate and will d/c before 2 midnights.  Dispo: The patient is from: Home              Anticipated d/c is to: Home              Anticipated d/c date is: 2 days              Patient currently is not medically stable to d/c.        Marinda Elk MD [facility] Pager [phone]  If 7PM-7AM, please contact night-coverage [email or web address] Use universal [place] password for that web site. If you do not have the password, please call the hospital operator.  05/12/2020, 3:02 AM

## 2020-05-12 NOTE — Consult Note (Addendum)
Renal Service Consult Note [facility]  Sean Charles 05/12/2020 Sean Charles Requesting Physician:  Dr Karleen Hampshire, V.   Reason for Consult:  Renal failure HPI: The patient is a 39 y.o. year-old presenting on 8/13 w/ SOB/ DOE for last 1 month. Recently developed orthopnea and PND. Pt went to [facility] where BP was as high as 229/167, creat 2.34 and BNP 396.  Pt was admitted for HTN crisis on 8/14 am.  Pt was started on norvasc 10, coreg 6.25 bid, IV lasix 40 bid. SBP's are down to 160 -180 range, diast BP's still up 110- 130.  Asked to see for renal failure.   No prior admissions here. Ca 8.9, alb 3.5, CO2 24, BUN 30 and creat 2.34.  AG 11. eGFR 30. WBC 8k  Hb 15 plt 102.    Pt seen in room, has not had any hx of HTN or kidney disease, occ use of nsaids , infrequent.  No fam hx of kidney disease. No voiding difficulties or change in urine color.   ROS  denies CP  no joint pain   no HA  no blurry vision  no rash  no diarrhea  no nausea/ vomiting  no dysuria  no difficulty voiding  no change in urine color    Past Medical History History reviewed. No pertinent past medical history. Past Surgical History History reviewed. No pertinent surgical history. Family History  Family History  Problem Relation Age of Onset  . Heart disease Maternal Uncle    Social History  reports that he quit smoking about 5 years ago. He has never used smokeless tobacco. He reports current alcohol use of about 7.0 standard drinks of alcohol per week. He reports that he does not use drugs. Allergies No Known Allergies Home medications Prior to Admission medications   Not on File     Vitals:   05/12/20 1148 05/12/20 1600 05/12/20 1645 05/12/20 1940  BP: (!) 163/118 (!) 171/125 (!) 171/125 (!) 165/114  Pulse:  92 99 94  Resp:  16  18  Temp:  98.3 F (36.8 C)  98 F (36.7 C)  TempSrc:  Skin  Oral  SpO2:    98%  Weight:      Height:       Exam Gen alert, no distress, WDWN No rash,  cyanosis or gangrene Sclera anicteric, throat clear  No jvd or bruits Chest clear bilat to bases, no rales or wheezing RRR no MRG Abd soft ntnd no mass or ascites +bs GU normal male defer MS no joint effusions or deformity Ext no LE or UE edema, no wounds or ulcers Neuro is alert, Ox 3 , nf    Home meds:  - none     CXR - no acute disease   Renal US 8/14 - IMPRESSION: Normal renal ultrasound. No hydronephrosis or other acute abnormality   UA 8/14 - negative   UNa 140, UCr 45     Assessment/ Plan: 1. HTN crisis - improving w/ po meds. Will ^coreg and add hydralazine. No acei/ ARB for now. Cont norvasc. DC IV lasix. Will follow.  2. Renal failure - UA and renal US both negative, this may be acute renal injury from malignant HTN, and/or some chronic damage. No obstruction by [place], no signs of GN per UA. Albumin is not sig low.  No vol overload or proteinuria. No anemia or hypercalcemia. Suspect this is all related to HTN.  Plan is for slow progressive control of BP  and will need also renal f/u for HTN and renal failure.  3. Hypokalemia - replacing. Consider w/u for primary aldosteronism when more stable.       Kelly Splinter  MD 05/12/2020, 9:00 PM  Recent Labs  Lab 05/11/20 1534 05/12/20 0345  WBC 8.8 9.4  HGB 15.0 15.2   Recent Labs  Lab 05/11/20 1534 05/12/20 0345  K 3.4* 2.8*  BUN 30* 23*  CREATININE 2.34* 2.33*  CALCIUM 8.9 8.9

## 2020-05-12 NOTE — Progress Notes (Signed)
  Echocardiogram 2D Echocardiogram has been performed.  Sean Charles 05/12/2020, 3:22 PM

## 2020-05-12 NOTE — Progress Notes (Signed)
Pt seen and admitted by Dr Leafy Half this am.  39 year old male without any past medical history who presents to med [facility] emergency department with cough and exertional dyspnea. He was found to be in hypertensive crisis and AKI.  His labs revealed elevated BNP, mildly elevated troponin, PND, and orthopnea and sob on exertion.    Pt seen and examined at bedside.  He is alert and oriented.  CVS s1s2, RRR Lungs clear to auscultation, no wheezing heard.  abd is soft non tender non distended.    Plan:  Continue with norvasc, coreg and IV lasix.  Replace potassium  Echocardiogram.  Nephrology consult for evaluation of AKI.    Emila Steinhauser,MD

## 2020-05-13 ENCOUNTER — Encounter (HOSPITAL_COMMUNITY): Payer: Self-pay | Admitting: Internal Medicine

## 2020-05-13 DIAGNOSIS — I169 Hypertensive crisis, unspecified: Secondary | ICD-10-CM

## 2020-05-13 DIAGNOSIS — I5041 Acute combined systolic (congestive) and diastolic (congestive) heart failure: Secondary | ICD-10-CM

## 2020-05-13 DIAGNOSIS — N179 Acute kidney failure, unspecified: Secondary | ICD-10-CM

## 2020-05-13 LAB — CBC WITH DIFFERENTIAL/PLATELET
Abs Immature Granulocytes: 0.06 10*3/uL (ref 0.00–0.07)
Basophils Absolute: 0.1 10*3/uL (ref 0.0–0.1)
Basophils Relative: 1 %
Eosinophils Absolute: 0.3 10*3/uL (ref 0.0–0.5)
Eosinophils Relative: 3 %
HCT: 43.5 % (ref 39.0–52.0)
Hemoglobin: 15.3 g/dL (ref 13.0–17.0)
Immature Granulocytes: 1 %
Lymphocytes Relative: 25 %
Lymphs Abs: 2.1 10*3/uL (ref 0.7–4.0)
MCH: 32.2 pg (ref 26.0–34.0)
MCHC: 35.2 g/dL (ref 30.0–36.0)
MCV: 91.6 fL (ref 80.0–100.0)
Monocytes Absolute: 0.7 10*3/uL (ref 0.1–1.0)
Monocytes Relative: 8 %
Neutro Abs: 5.3 10*3/uL (ref 1.7–7.7)
Neutrophils Relative %: 62 %
Platelets: 105 10*3/uL — ABNORMAL LOW (ref 150–400)
RBC: 4.75 MIL/uL (ref 4.22–5.81)
RDW: 14.9 % (ref 11.5–15.5)
WBC: 8.4 10*3/uL (ref 4.0–10.5)
nRBC: 0 % (ref 0.0–0.2)

## 2020-05-13 LAB — BASIC METABOLIC PANEL
Anion gap: 10 (ref 5–15)
BUN: 29 mg/dL — ABNORMAL HIGH (ref 6–20)
CO2: 24 mmol/L (ref 22–32)
Calcium: 9.3 mg/dL (ref 8.9–10.3)
Chloride: 102 mmol/L (ref 98–111)
Creatinine, Ser: 2.59 mg/dL — ABNORMAL HIGH (ref 0.61–1.24)
GFR calc Af Amer: 35 mL/min — ABNORMAL LOW (ref >60)
GFR calc non Af Amer: 30 mL/min — ABNORMAL LOW (ref >60)
Glucose, Bld: 138 mg/dL — ABNORMAL HIGH (ref 70–99)
Potassium: 3.5 mmol/L (ref 3.5–5.1)
Sodium: 136 mmol/L (ref 135–145)

## 2020-05-13 LAB — UREA NITROGEN, URINE: Urea Nitrogen, Ur: 257 mg/dL

## 2020-05-13 MED ORDER — HYDRALAZINE HCL 50 MG PO TABS
50.0000 mg | ORAL_TABLET | Freq: Three times a day (TID) | ORAL | Status: DC
  Administered 2020-05-13 – 2020-05-14 (×3): 50 mg via ORAL
  Filled 2020-05-13 (×3): qty 1

## 2020-05-13 MED ORDER — CARVEDILOL 25 MG PO TABS
25.0000 mg | ORAL_TABLET | Freq: Two times a day (BID) | ORAL | Status: DC
  Administered 2020-05-13 – 2020-05-14 (×2): 25 mg via ORAL
  Filled 2020-05-13 (×2): qty 1

## 2020-05-13 NOTE — Progress Notes (Addendum)
[facility] Progress Note  Subjective: no c/o , seen in room, wants help finding a PCP  Vitals:   05/13/20 0911 05/13/20 1023 05/13/20 1030 05/13/20 1123  BP: (!) 179/131 (!) 192/128 (!) 178/125 (!) 159/108  Pulse: (!) 103   86  Resp: 16   13  Temp: 98.7 F (37.1 C)   98.5 F (36.9 C)  TempSrc: Oral   Oral  SpO2: 97%   98%  Weight:      Height:        Exam: Gen alert, no distress, WDWN No jvd or bruits Chest clear bilat to bases RRR no MRG Abd soft ntnd no mass or ascites +bs Ext no LE or UE edema Neuro is alert, Ox 3 , nf    Home meds:  - none     CXR - no acute disease   Renal US 8/14 - IMPRESSION: Normal renal ultrasound. No hydronephrosis or other acute abnormality   UA 8/14 - negative   UNa 140, UCr 45     Assessment/ Plan: 1. HTN crisis - improving w/ po meds. No acei/ ARB for now. DBP still sig high, will ^hydralazine and coreg again today. Cont norvasc.  2. Renal failure - UA and renal US both negative, this may be acute renal injury from malignant HTN, and/or some chronic damage. No obstruction by [place], no signs of GN per UA. Albumin is not sig low.  No vol overload or proteinuria. Lasix dc'd. No anemia or hypercalcemia. Suspect this is all related to HTN.  Plan is for slow progressive control of BP and will need also renal f/u for HTN and renal failure.  3. Hypokalemia - replacing. Consider w/u for primary aldosteronism when more stable. K+ is better.      Sean Charles 05/13/2020, 11:53 AM   Recent Labs  Lab 05/12/20 0345 05/13/20 0933  K 2.8* 3.5  BUN 23* 29*  CREATININE 2.33* 2.59*  CALCIUM 8.9 9.3  HGB 15.2 15.3   Inpatient medications: . amLODipine  10 mg Oral Daily  . carvedilol  12.5 mg Oral BID WC  . enoxaparin (LOVENOX) injection  40 mg Subcutaneous Daily  . hydrALAZINE  25 mg Oral Q8H  . sodium chloride flush  3 mL Intravenous Q12H   . sodium chloride     sodium chloride, acetaminophen, hydrALAZINE,  melatonin, ondansetron (ZOFRAN) IV, polyethylene glycol, sodium chloride flush

## 2020-05-13 NOTE — Consult Note (Signed)
Cardiology Consultation:   Patient ID: Sean Charles MRN: [id number]; DOB: May 17, 1981  Admit date: 05/11/2020 Date of Consult: 05/13/2020  Primary Care Provider: Patient, No Pcp Per [facility] Cardiologist: New - Dr. Sylvie [facility] Electrophysiologist:  None     Patient Profile:   Sean Charles is a 39 y.o. male with no significant past medical history who is being seen today for the evaluation of new onset systolic and diastolic heart failure in the setting of hypertensive crisis at the request of Dr. Kathlen Mody.  History of Present Illness:   Mr. Wade was admitted yesterday after presenting to [facility] with symptoms of progressively worsening dyspnea on exertion and cough.  BP was 229/167.  He was tx with a dose of Amlodipine and IV Labetalol.  His SCr is 2.34.  His picture was felt to be consistent with acute CHF in the setting of HTN crisis.  He has been treated with Amlodipine 10 mg, Carvedilol 6.25 mg twice daily, Furosemide 40 mg IV twice daily.  An echocardiogram done yesterday demonstrated an EF of 45-50 and Grade 1 diastolic dysfunction.  He was seen by Nephrology.  There was no sign of obstruction or glomerulonephritis.  There was no proteinuria, anemia or hypercalcemia.  Furosemide was stopped.  He was placed on Hydralazine for BP.  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  He has not exercised regularly over the past few years and has gained weight.  He eats out with clients and eats fast food a lot.  He does not see a healthcare provider on a regular basis.  He did not know that his BP was high.  He started to feel short of breath with mild to mod activities about a month ago and felt it was b/c he is out of shape and has gained weight.  He was having orthopnea and paroxysmal nocturnal dyspnea as well as a non-productive cough.  He now feels better after receiving IV Furosemide.  He feels his breathing is back to normal.  He has not had  chest pain with any of this.  He has not had syncope, leg swelling.  He notes he felt lightheaded at one point when his SBP got down to 140.    Data  K+ 3.4 >> 2.8 >> 3.5 SCr 2.34 >> 2.33 >> 2.59 AST 40, ALT 87, Alb 3.5, Hgb 15.3 Hs-Trop 38 >> 36 BNP 396.7 SARS-CoV-2 neg CXR: possible bronchitis pattern Renal US: normal EKG (05/11/2020): sinus tachycardia, HR 105, normal axis, LVH, TW inversions 1, aVL, V6 (?repol abnl), QTc 475 Echocardiogram 81/4/21: EF 45-50, Gr 1 DD  Past Medical History:  Diagnosis Date   Cardiomyopathy (HCC)    Chronic kidney disease    elevated SCr during admit for HTN crisis (first recognized) 04/2020   Combined systolic & diastolic CHF    HTN (hypertension) 04/2020    History reviewed. No pertinent surgical history.   Home Medications:  Prior to Admission medications   Not on File    Inpatient Medications: Scheduled Meds:  amLODipine  10 mg Oral Daily   carvedilol  25 mg Oral BID WC   enoxaparin (LOVENOX) injection  40 mg Subcutaneous Daily   hydrALAZINE  50 mg Oral Q8H   sodium chloride flush  3 mL Intravenous Q12H   Continuous Infusions:  sodium chloride     PRN Meds: sodium chloride, acetaminophen, hydrALAZINE, melatonin, ondansetron (ZOFRAN) IV, polyethylene glycol, sodium chloride flush  Allergies:  No Known Allergies  Social History:   Social History   Socioeconomic History   Marital status: Married    Spouse name: Not on file   Number of children: 1   Years of education: Not on file   Highest education level: Not on file  Occupational History   Occupation: Public affairs consultant  Tobacco Use   Smoking status: Former Smoker    Quit date: 2016    Years since quitting: 5.6   Smokeless tobacco: Never Used  Building services engineer Use: Never used  Substance and Sexual Activity   Alcohol use: Yes    Alcohol/week: 7.0 standard drinks    Types: 7 Cans of beer per week    Comment: daily   Drug use: Not Currently     Types: Cocaine    Comment: No use in many years   Sexual activity: Not on file  Other Topics Concern   Not on file  Social History Narrative   Former Conservation officer, nature   Social Determinants of Health   Financial Resource Strain:    Difficulty of Paying Living Expenses:   Food Insecurity:    Worried About Programme researcher, broadcasting/film/video in the Last Year:    Barista in the Last Year:   Transportation Needs:    Freight forwarder (Medical):    Lack of Transportation (Non-Medical):   Physical Activity:    Days of Exercise per Week:    Minutes of Exercise per Session:   Stress:    Feeling of Stress :   Social Connections:    Frequency of Communication with Friends and Family:    Frequency of Social Gatherings with Friends and Family:    Attends Religious Services:    Active Member of Clubs or Organizations:    Attends Engineer, structural:    Marital Status:   Intimate Partner Violence:    Fear of Current or Ex-Partner:    Emotionally Abused:    Physically Abused:    Sexually Abused:     Family History:     Family History  Problem Relation Age of Onset   Heart disease Maternal Uncle      ROS:  Please see the history of present illness.  He notes a couple of episodes of painless hematuria in the past. He has not had fever, chills, melena, hematochezia, claudication. All other ROS reviewed and negative.     Physical Exam/Data:   Vitals:   05/13/20 0911 05/13/20 1023 05/13/20 1030 05/13/20 1123  BP: (!) 179/131 (!) 192/128 (!) 178/125 (!) 159/108  Pulse: (!) 103   86  Resp: 16   13  Temp: 98.7 F (37.1 C)   98.5 F (36.9 C)  TempSrc: Oral   Oral  SpO2: 97%   98%  Weight:      Height:        Intake/Output Summary (Last 24 hours) at 05/13/2020 1220 Last data filed at 05/13/2020 1000 Gross per 24 hour  Intake 223 ml  Output 600 ml  Net -377 ml   Last 3 Weights 05/13/2020 05/12/2020 05/11/2020  Weight (lbs) 235 lb 14.3 oz 235 lb 14.3 oz  237 lb 4.8 oz  Weight (kg) 107 kg 107 kg 107.639 kg     Body mass index is 33.85 kg/m.  General:  Well nourished, well developed, in no acute distress  HEENT: normal Lymph: no cervical adenopathy Neck: no JVD Endocrine:  No thryomegaly Vascular: No carotid bruits  Cardiac:  normal S1, S2; RRR; no murmur   Lungs:  clear to auscultation bilaterally, no wheezing, rhonchi or rales  Abd: soft, nontender, no hepatomegaly  Ext: no edema Musculoskeletal:  No deformities  Skin: warm and dry  Neuro:  CNs 2-12 intact, no focal abnormalities noted Psych:  Normal affect   EKG:  The EKG was personally reviewed and demonstrates:  See above  Telemetry:  Telemetry was personally reviewed and demonstrates:  Normal sinus rhythm    Relevant CV Studies: Echocardiogram 05/12/20 1. Left ventricular ejection fraction, by estimation, is 45 to 50%. The  left ventricle has mildly decreased function. The left ventricle has no  regional wall motion abnormalities. There is moderate left ventricular  hypertrophy. Left ventricular  diastolic parameters are consistent with Grade I diastolic dysfunction  (impaired relaxation).  2. Right ventricular systolic function is normal. The right ventricular  size is normal. Tricuspid regurgitation signal is inadequate for assessing  PA pressure.  3. The mitral valve is grossly normal. No evidence of mitral valve  regurgitation. No evidence of mitral stenosis.  4. The aortic valve is tricuspid. Aortic valve regurgitation is not  visualized. No aortic stenosis is present.  5. Aortic dilatation noted. There is mild dilatation of the aortic root  measuring 40 mm.  6. The inferior vena cava is normal in size with greater than 50%  respiratory variability, suggesting right atrial pressure of 3 mmHg.    Laboratory Data:  High Sensitivity Troponin:   Recent Labs  Lab 05/12/20 0841 05/12/20 1443  TROPONINIHS 38* 36*     Chemistry Recent Labs  Lab  05/11/20 1534 05/12/20 0345 05/13/20 0933  NA 137 135 136  K 3.4* 2.8* 3.5  CL 102 101 102  CO2 24 23 24   GLUCOSE 102* 119* 138*  BUN 30* 23* 29*  CREATININE 2.34* 2.33* 2.59*  CALCIUM 8.9 8.9 9.3  GFRNONAA 34* 34* 30*  GFRAA 39* 39* 35*  ANIONGAP 11 11 10     Recent Labs  Lab 05/12/20 0345  PROT 6.4*  ALBUMIN 3.5  AST 40  ALT 87*  ALKPHOS 67  BILITOT 1.5*   Hematology Recent Labs  Lab 05/11/20 1534 05/12/20 0345 05/13/20 0933  WBC 8.8 9.4 8.4  RBC 4.63 4.68 4.75  HGB 15.0 15.2 15.3  HCT 42.3 42.0 43.5  MCV 91.4 89.7 91.6  MCH 32.4 32.5 32.2  MCHC 35.5 36.2* 35.2  RDW 14.9 14.6 14.9  PLT 102* 97* 105*   BNP Recent Labs  Lab 05/11/20 1534  BNP 396.7*    DDimer No results for input(s): DDIMER in the last 168 hours.   Radiology/Studies:  DG Chest 2 View Result Date: 05/11/2020 CLINICAL DATA:  Cough. Nasal congestion. Shortness of breath. Two weeks duration. EXAM: CHEST - 2 VIEW COMPARISON:  None. FINDINGS: Heart size upper limits of normal. Mildly tortuous aorta. No consolidation, collapse or effusion. Possible bronchial thickening suggesting bronchitis. No significant bone finding. IMPRESSION: Possible bronchitis pattern. No consolidation or collapse. Electronically Signed   By: Paulina FusiMark  Shogry M.D.   On: 05/11/2020 13:49   US RENAL Result Date: 05/12/2020 CLINICAL DATA:  Initial evaluation for acute renal failure. EXAM: RENAL / URINARY TRACT ULTRASOUND COMPLETE COMPARISON:  None. FINDINGS: Right Kidney: Renal measurements: 12.3 x 4.2 x 4.4 cm = volume: 119.3 mL. Renal echogenicity within normal limits. No nephrolithiasis or hydronephrosis. No focal renal mass. Left Kidney: Renal measurements: 12.5 x 4.6 x 4.7 cm = volume: 142.0 mL. Renal echogenicity within normal limits. No nephrolithiasis  or hydronephrosis. No focal renal mass. Bladder: Appears normal for degree of bladder distention. Other: None. IMPRESSION: Normal renal ultrasound. No hydronephrosis or other  acute abnormality. Electronically Signed   By: Rise Mu M.D.   On: 05/12/2020 07:02          New York Heart Association (NYHA) Functional Class NYHA Class III  Assessment and Plan:   1.  Combined Systolic and Diastolic CHF 2.  Dilated Cardiomyopathy Suspect his mildly reduced EF is non-ischemic and related to uncontrolled hypertension.  EF 45-50.  NYHA 3 >> now improved to 1-2.  Volume status appears normal now.  His hs-Trop is mildly elevated without significant delta.  He has not had ischemic symptoms.  He would not be a candidate for cardiac catheterization at this time b/c of his renal insufficiency.  The plan will be to continue to normalize his BP and, once his BP is normal, recheck his echocardiogram.  If his EF returns to normal, no further testing will be needed.  If his EF remains down, we will need to consider nuclear stress testing vs Coronary CTA.    3. Hypertensive Crisis BP is improving.  Continue Amlodipine, Carvedilol, Hydralazine.  We could add Isosorbide if needed (he is not on PDE-5 inhibitors).  Would slowly titrate meds as he has symptoms when his BP drops too fast.  He has likely been hypertensive for a long time. No ACE or ARB or Entresto for now given renal insufficiency.    4. Renal Failure Probable chronic kidney disease due to uncontrolled hypertension.  He is being followed by Nephrology as well.     5. Morbid obesity BMI 33.85.  We discussed weight loss, low sodium diet and activity.   I have recommended plant based diet or Mediterranean diet and 2 g Na diet.   6. Hematuria  He has had 2 episodes in the past.  They have been painless.  No recurrence in weeks.  Hgb is normal.  I recommended that he follow up with urology post DC.         For questions or updates, please contact [facility] Please consult [email or web address] for contact info under    Signed, Tereso Newcomer, PA-C  05/13/2020 12:20 PM

## 2020-05-13 NOTE — Progress Notes (Signed)
Patient's BP 182/127 confirmed manually, IV Hydralazine administered; Loney Loh, MD paged. Will continue to monitor.   Bari Edward, RN

## 2020-05-13 NOTE — Progress Notes (Signed)
PROGRESS NOTE    Sean Charles  ZOX:[id number] DOB: [birth year] DOA: 05/11/2020 PCP: Patient, No Pcp Per    Chief Complaint  Patient presents with  . Hypertension    Brief Narrative:  39 year old male without any past medical history who presents to [facility] emergency department with cough and exertional dyspnea. He was found to be in hypertensive crisis and AKI.  His labs revealed elevated BNP, mildly elevated troponin, PND, and orthopnea and sob on exertion.  Assessment & Plan:   Principal Problem:   Hypertensive crisis Active Problems:   AKI (acute kidney injury) (HCC)   Acute on chronic congestive heart failure (HCC)   Mild acute combined systolic and diastolic heart failure. Patient currently appears to be compensated at this time.  He was initially diuresed with IV Lasix and Lasix has been discontinued at this time. Cardiology consulted to see if if he needs further intervention. Echocardiogram showed left ventricular ejection fraction of 45 to 50% and grade 1 diastolic dysfunction.    Hypertensive crisis  blood pressure improving slowly, patient symptomatic with aggressive blood pressure control with nausea vomiting.  Continue with Norvasc, Coreg and hydralazine at this time.  No ARB at this time.    Acute kidney injury suspect probably secondary to prolonged uncontrolled hypertension. Appreciate nephrology consultation and recommendation.  Plan for outpatient follow-up with nephrology on discharge.     Body mass index is 33.85 kg/m. Obesity Recommend outpatient follow-up with PCP regarding weight loss and physical activity.    Patient reports hematuria in the past None this admission.  Continue to monitor.  Urine analysis reviewed.   DVT prophylaxis: lovenox.  Code Status: full code.  Family Communication: family at bedside.  Disposition:   Status is: Inpatient  Remains inpatient appropriate because:Ongoing diagnostic testing needed not  appropriate for outpatient work up   Dispo: The patient is from: Home              Anticipated d/c is to: Home              Anticipated d/c date is: 1 day              Patient currently is not medically stable to d/c.       Consultants:  Cardiology Nephrology.   Procedures: Echocardiogram.  US renal.  Vascular renal duplex.   Antimicrobials: none.   Subjective: PT reports he does not feel different from yesterday, nausea, vomiting has resolved.  Pt denies any chest pain. Sob has improved. No cough today.   Objective: Vitals:   05/13/20 1030 05/13/20 1123 05/13/20 1416 05/13/20 1512  BP: (!) 178/125 (!) 159/108 (!) 150/103 (!) 166/111  Pulse:  86  88  Resp:  13    Temp:  98.5 F (36.9 C)    TempSrc:  Oral    SpO2:  98%    Weight:      Height:        Intake/Output Summary (Last 24 hours) at 05/13/2020 1718 Last data filed at 05/13/2020 1000 Gross per 24 hour  Intake 223 ml  Output 400 ml  Net -177 ml   Filed Weights   05/11/20 2342 05/12/20 0326 05/13/20 0409  Weight: 107.6 kg 107 kg 107 kg    Examination:  General exam: Appears calm and comfortable  Respiratory system: Clear to auscultation. Respiratory effort normal. Cardiovascular system: S1 & S2 heard, RRR. No JVD,  No pedal edema. Gastrointestinal system: Abdomen is nondistended, soft and nontender. Normal bowel  sounds heard. Central nervous system: Alert and oriented. No focal neurological deficits. Extremities: Symmetric 5 x 5 power. Skin: No rashes, lesions or ulcers Psychiatry:  Mood & affect appropriate.     Data Reviewed: I have personally reviewed following labs and imaging studies  CBC: Recent Labs  Lab 05/11/20 1534 05/12/20 0345 05/13/20 0933  WBC 8.8 9.4 8.4  NEUTROABS 5.8 6.1 5.3  HGB 15.0 15.2 15.3  HCT 42.3 42.0 43.5  MCV 91.4 89.7 91.6  PLT 102* 97* 105*    Basic Metabolic Panel: Recent Labs  Lab 05/11/20 1534 05/12/20 0345 05/13/20 0933  NA 137 135 136  K 3.4*  2.8* 3.5  CL 102 101 102  CO2 24 23 24   GLUCOSE 102* 119* 138*  BUN 30* 23* 29*  CREATININE 2.34* 2.33* 2.59*  CALCIUM 8.9 8.9 9.3  MG  --  2.1  --     GFR: Estimated Creatinine Clearance: 46.9 mL/min (A) (by C-G formula based on SCr of 2.59 mg/dL (H)).  Liver Function Tests: Recent Labs  Lab 05/12/20 0345  AST 40  ALT 87*  ALKPHOS 67  BILITOT 1.5*  PROT 6.4*  ALBUMIN 3.5    CBG: No results for input(s): GLUCAP in the last 168 hours.   Recent Results (from the past 240 hour(s))  Novel Coronavirus, NAA ([facility])     Status: None   Collection Time: 05/11/20  1:14 PM   Specimen: Nasopharyngeal Swab; Nasopharyngeal(NP) swabs in vial transport medium   Nasopharynge  Result Value Ref Range Status   SARS-CoV-2, NAA Not Detected Not Detected Final    Comment: This nucleic acid amplification test was developed and its performance characteristics determined by 05/13/20. Nucleic acid amplification tests include RT-PCR and TMA. This test has not been FDA cleared or approved. This test has been authorized by FDA under an Emergency Use Authorization (EUA). This test is only authorized for the duration of time the declaration that circumstances exist justifying the authorization of the emergency use of in vitro diagnostic tests for detection of SARS-CoV-2 virus and/or diagnosis of COVID-19 infection under section 564(b)(1) of the Act, 21 U.S.C. [employer]) (1), unless the authorization is terminated or revoked sooner. When diagnostic testing is negative, the possibility of a false negative result should be considered in the context of a patient's recent exposures and the presence of clinical signs and symptoms consistent with COVID-19. An individual without symptoms of COVID-19 and who is not shedding SARS-CoV-2 virus wo uld expect to have a negative (not detected) result in this assay.   SARS-COV-2, NAA 2 DAY TAT     Status: None   Collection Time: 05/11/20  1:14  PM   Nasopharynge  Result Value Ref Range Status   SARS-CoV-2, NAA 2 DAY TAT Performed  Final  SARS Coronavirus 2 by RT PCR (hospital order, performed in [facility] hospital lab) Nasopharyngeal Nasopharyngeal Swab     Status: None   Collection Time: 05/11/20  8:45 PM   Specimen: Nasopharyngeal Swab  Result Value Ref Range Status   SARS Coronavirus 2 NEGATIVE NEGATIVE Final    Comment: (NOTE) SARS-CoV-2 target nucleic acids are NOT DETECTED.  The SARS-CoV-2 RNA is generally detectable in upper and lower respiratory specimens during the acute phase of infection. The lowest concentration of SARS-CoV-2 viral copies this assay can detect is 250 copies / mL. A negative result does not preclude SARS-CoV-2 infection and should not be used as the sole basis for treatment or other patient management decisions.  A negative result may occur with improper specimen collection / handling, submission of specimen other than nasopharyngeal swab, presence of viral mutation(s) within the areas targeted by this assay, and inadequate number of viral copies (<250 copies / mL). A negative result must be combined with clinical observations, patient history, and epidemiological information.  Fact Sheet for Patients:   BoilerBrush.com.cy  Fact Sheet for Healthcare Providers: https://pope.com/  This test is not yet approved or  cleared by the [place] FDA and has been authorized for detection and/or diagnosis of SARS-CoV-2 by FDA under an Emergency Use Authorization (EUA).  This EUA will remain in effect (meaning this test can be used) for the duration of the COVID-19 declaration under Section 564(b)(1) of the Act, 21 U.S.C. section 360bbb-3(b)(1), unless the authorization is terminated or revoked sooner.  Performed at [facility], [address] [place], [place] [address]          Radiology Studies: US RENAL  Result Date:  May 28, 2020 CLINICAL DATA:  Initial evaluation for acute renal failure. EXAM: RENAL / URINARY TRACT ULTRASOUND COMPLETE COMPARISON:  None. FINDINGS: Right Kidney: Renal measurements: 12.3 x 4.2 x 4.4 cm = volume: 119.3 mL. Renal echogenicity within normal limits. No nephrolithiasis or hydronephrosis. No focal renal mass. Left Kidney: Renal measurements: 12.5 x 4.6 x 4.7 cm = volume: 142.0 mL. Renal echogenicity within normal limits. No nephrolithiasis or hydronephrosis. No focal renal mass. Bladder: Appears normal for degree of bladder distention. Other: None. IMPRESSION: Normal renal ultrasound. No hydronephrosis or other acute abnormality. Electronically Signed   By: Rise Mu M.D.   On: May 28, 2020 07:02   ECHOCARDIOGRAM COMPLETE  Result Date: 05-28-2020    ECHOCARDIOGRAM REPORT   Patient Name:   Sean Charles Date of Exam: 28-May-2020 Medical Rec #:  [id number]    Height:       70.0 in Accession #:    [id number]   Weight:       235.9 lb Date of Birth:  12/01/1980    BSA:          2.239 m Patient Age:    38 years     BP:           163/118 mmHg Patient Gender: M            HR:           94 bpm. Exam Location:  Inpatient Procedure: 2D Echo, Cardiac Doppler and Color Doppler Indications:    CHF-Acute Systolic 428.21 / I50.21  History:        Patient has no prior history of Echocardiogram examinations.                 Risk Factors:Former Smoker.  Sonographer:    Renella Cunas RDCS Referring Phys: [id number] Deno Lunger [facility] IMPRESSIONS  1. Left ventricular ejection fraction, by estimation, is 45 to 50%. The left ventricle has mildly decreased function. The left ventricle has no regional wall motion abnormalities. There is moderate left ventricular hypertrophy. Left ventricular diastolic parameters are consistent with Grade I diastolic dysfunction (impaired relaxation).  2. Right ventricular systolic function is normal. The right ventricular size is normal. Tricuspid regurgitation signal is inadequate for  assessing PA pressure.  3. The mitral valve is grossly normal. No evidence of mitral valve regurgitation. No evidence of mitral stenosis.  4. The aortic valve is tricuspid. Aortic valve regurgitation is not visualized. No aortic stenosis is present.  5. Aortic dilatation noted. There is mild dilatation of  the aortic root measuring 40 mm.  6. The inferior vena cava is normal in size with greater than 50% respiratory variability, suggesting right atrial pressure of 3 mmHg. FINDINGS  Left Ventricle: Left ventricular ejection fraction, by estimation, is 45 to 50%. The left ventricle has mildly decreased function. The left ventricle has no regional wall motion abnormalities. The left ventricular internal cavity size was normal in size. There is moderate left ventricular hypertrophy. Left ventricular diastolic parameters are consistent with Grade I diastolic dysfunction (impaired relaxation). Right Ventricle: The right ventricular size is normal. No increase in right ventricular wall thickness. Right ventricular systolic function is normal. Tricuspid regurgitation signal is inadequate for assessing PA pressure. Left Atrium: Left atrial size was normal in size. Right Atrium: Right atrial size was normal in size. Pericardium: A small pericardial effusion is present. The pericardial effusion is circumferential. Mitral Valve: The mitral valve is grossly normal. No evidence of mitral valve regurgitation. No evidence of mitral valve stenosis. Tricuspid Valve: The tricuspid valve is grossly normal. Tricuspid valve regurgitation is not demonstrated. No evidence of tricuspid stenosis. Aortic Valve: The aortic valve is tricuspid. Aortic valve regurgitation is not visualized. No aortic stenosis is present. Pulmonic Valve: The pulmonic valve was grossly normal. Pulmonic valve regurgitation is not visualized. No evidence of pulmonic stenosis. Aorta: Aortic dilatation noted. There is mild dilatation of the aortic root measuring 40 mm.  Venous: The inferior vena cava is normal in size with greater than 50% respiratory variability, suggesting right atrial pressure of 3 mmHg. IAS/Shunts: The atrial septum is grossly normal.  LEFT VENTRICLE PLAX 2D LVIDd:         5.30 cm      Diastology LVIDs:         3.90 cm      LV e' lateral:   6.15 cm/s LV PW:         1.46 cm      LV E/e' lateral: 8.7 LV IVS:        1.33 cm      LV e' medial:    5.40 cm/s LVOT diam:     2.30 cm      LV E/e' medial:  10.0 LV SV:         50 LV SV Index:   22 LVOT Area:     4.15 cm  LV Volumes (MOD) LV vol d, MOD A2C: 153.0 ml LV vol d, MOD A4C: 149.0 ml LV vol s, MOD A2C: 79.7 ml LV vol s, MOD A4C: 79.6 ml LV SV MOD A2C:     73.3 ml LV SV MOD A4C:     149.0 ml LV SV MOD BP:      71.2 ml RIGHT VENTRICLE RV S prime:     8.73 cm/s TAPSE (M-mode): 1.7 cm LEFT ATRIUM             Index       RIGHT ATRIUM           Index LA diam:        4.30 cm 1.92 cm/m  RA Area:     13.10 cm LA Vol (A2C):   72.3 ml 32.29 ml/m RA Volume:   32.70 ml  14.60 ml/m LA Vol (A4C):   59.8 ml 26.71 ml/m LA Biplane Vol: 67.3 ml 30.06 ml/m  AORTIC VALVE LVOT Vmax:   73.60 cm/s LVOT Vmean:  49.800 cm/s LVOT VTI:    0.120 m  AORTA Ao Root diam: 4.00 cm Ao Asc  diam:  4.20 cm MITRAL VALVE MV Area (PHT): 5.50 cm    SHUNTS MV Decel Time: 138 msec    Systemic VTI:  0.12 m MV E velocity: 53.80 cm/s  Systemic Diam: 2.30 cm MV A velocity: 49.40 cm/s MV E/A ratio:  1.09 Lennie Odor MD Electronically signed by Lennie Odor MD Signature Date/Time: 05/12/2020/3:39:36 PM    Final         Scheduled Meds: . amLODipine  10 mg Oral Daily  . carvedilol  25 mg Oral BID WC  . enoxaparin (LOVENOX) injection  40 mg Subcutaneous Daily  . hydrALAZINE  50 mg Oral Q8H  . sodium chloride flush  3 mL Intravenous Q12H   Continuous Infusions: . sodium chloride       LOS: 1 day        Kathlen Mody, MD [facility]   To contact the attending provider between 7A-7P or the covering provider during after  hours 7P-7A, please log into the web site [email or web address] and access using universal [place] password for that web site. If you do not have the password, please call the hospital operator.  05/13/2020, 5:18 PM

## 2020-05-14 ENCOUNTER — Encounter (HOSPITAL_COMMUNITY): Payer: Self-pay | Admitting: Internal Medicine

## 2020-05-14 ENCOUNTER — Inpatient Hospital Stay (HOSPITAL_COMMUNITY): Payer: BC Managed Care – PPO

## 2020-05-14 DIAGNOSIS — I161 Hypertensive emergency: Principal | ICD-10-CM

## 2020-05-14 DIAGNOSIS — I160 Hypertensive urgency: Secondary | ICD-10-CM

## 2020-05-14 DIAGNOSIS — I509 Heart failure, unspecified: Secondary | ICD-10-CM

## 2020-05-14 LAB — BASIC METABOLIC PANEL
Anion gap: 10 (ref 5–15)
BUN: 30 mg/dL — ABNORMAL HIGH (ref 6–20)
CO2: 24 mmol/L (ref 22–32)
Calcium: 8.8 mg/dL — ABNORMAL LOW (ref 8.9–10.3)
Chloride: 104 mmol/L (ref 98–111)
Creatinine, Ser: 2.57 mg/dL — ABNORMAL HIGH (ref 0.61–1.24)
GFR calc Af Amer: 35 mL/min — ABNORMAL LOW (ref >60)
GFR calc non Af Amer: 30 mL/min — ABNORMAL LOW (ref >60)
Glucose, Bld: 109 mg/dL — ABNORMAL HIGH (ref 70–99)
Potassium: 3.3 mmol/L — ABNORMAL LOW (ref 3.5–5.1)
Sodium: 138 mmol/L (ref 135–145)

## 2020-05-14 LAB — LIPID PANEL
Cholesterol: 224 mg/dL — ABNORMAL HIGH (ref 0–200)
HDL: 34 mg/dL — ABNORMAL LOW (ref >40)
LDL Cholesterol: 155 mg/dL — ABNORMAL HIGH (ref 0–99)
Total CHOL/HDL Ratio: 6.6 RATIO
Triglycerides: 177 mg/dL — ABNORMAL HIGH (ref <150)
VLDL: 35 mg/dL (ref 0–40)

## 2020-05-14 MED ORDER — HYDRALAZINE HCL 100 MG PO TABS: 100.0000 mg | ORAL_TABLET | Freq: Three times a day (TID) | ORAL | 1 refills | Status: DC

## 2020-05-14 MED ORDER — POLYETHYLENE GLYCOL 3350 17 G PO PACK: 17.0000 g | PACK | Freq: Every day | ORAL | 0 refills | Status: DC | PRN

## 2020-05-14 MED ORDER — POTASSIUM CHLORIDE CRYS ER 20 MEQ PO TBCR
20.0000 meq | EXTENDED_RELEASE_TABLET | Freq: Once | ORAL | Status: AC
  Administered 2020-05-14: 20 meq via ORAL
  Filled 2020-05-14: qty 1

## 2020-05-14 MED ORDER — ATORVASTATIN CALCIUM 40 MG PO TABS
40.0000 mg | ORAL_TABLET | Freq: Every day | ORAL | Status: DC
  Administered 2020-05-14: 40 mg via ORAL
  Filled 2020-05-14: qty 1

## 2020-05-14 MED ORDER — HYDRALAZINE HCL 50 MG PO TABS
100.0000 mg | ORAL_TABLET | Freq: Three times a day (TID) | ORAL | Status: DC
  Administered 2020-05-14: 100 mg via ORAL
  Filled 2020-05-14: qty 2

## 2020-05-14 MED ORDER — ATORVASTATIN CALCIUM 40 MG PO TABS: 40.0000 mg | ORAL_TABLET | Freq: Every day | ORAL | 1 refills | Status: AC

## 2020-05-14 MED ORDER — LOSARTAN POTASSIUM 25 MG PO TABS: 25.0000 mg | ORAL_TABLET | Freq: Every day | ORAL | 1 refills | Status: AC

## 2020-05-14 MED ORDER — MELATONIN 3 MG PO TABS: 3.0000 mg | ORAL_TABLET | Freq: Every evening | ORAL | 0 refills | Status: DC | PRN

## 2020-05-14 MED ORDER — LOSARTAN POTASSIUM 25 MG PO TABS
25.0000 mg | ORAL_TABLET | Freq: Every day | ORAL | Status: DC
  Administered 2020-05-14: 25 mg via ORAL
  Filled 2020-05-14: qty 1

## 2020-05-14 MED ORDER — AMLODIPINE BESYLATE 10 MG PO TABS: 10.0000 mg | ORAL_TABLET | Freq: Every day | ORAL | 1 refills | Status: DC

## 2020-05-14 MED ORDER — CARVEDILOL 25 MG PO TABS: 25.0000 mg | ORAL_TABLET | Freq: Two times a day (BID) | ORAL | 1 refills | Status: DC

## 2020-05-14 NOTE — Progress Notes (Addendum)
Progress Note  Patient Name: Sean Charles Date of Encounter: 05/14/2020  [facility] Cardiologist: Jodelle Red, MD   Subjective   No acute overnight events. Seems frustrated this morning that BP is still not well controlled. No complaints though. No chest pain or shortness of breath.  Inpatient Medications    Scheduled Meds: . amLODipine  10 mg Oral Daily  . carvedilol  25 mg Oral BID WC  . enoxaparin (LOVENOX) injection  40 mg Subcutaneous Daily  . hydrALAZINE  100 mg Oral Q8H  . losartan  25 mg Oral Daily  . sodium chloride flush  3 mL Intravenous Q12H   Continuous Infusions: . sodium chloride     PRN Meds: sodium chloride, acetaminophen, hydrALAZINE, melatonin, ondansetron (ZOFRAN) IV, polyethylene glycol, sodium chloride flush   Vital Signs    Vitals:   05/13/20 1947 05/14/20 0448 05/14/20 0449 05/14/20 0745  BP: (!) 156/103 (!) 183/104  (!) 186/120  Pulse: 85  87 87  Resp:  16  16  Temp: 98.3 F (36.8 C)  98.6 F (37 C) 97.7 F (36.5 C)  TempSrc: Oral  Oral Oral  SpO2: 98%  98% 96%  Weight:   109 kg   Height:        Intake/Output Summary (Last 24 hours) at 05/14/2020 0750 Last data filed at 05/13/2020 1400 Gross per 24 hour  Intake 460 ml  Output 400 ml  Net 60 ml   Last 3 Weights 05/14/2020 05/13/2020 05/12/2020  Weight (lbs) 240 lb 4.8 oz 235 lb 14.3 oz 235 lb 14.3 oz  Weight (kg) 109 kg 107 kg 107 kg      Telemetry    Normal sinus rhythm with rates in the 70's to 90's. - Personally Reviewed  ECG    No new ECG tracing today. - Personally Reviewed  Physical Exam   GEN: No acute distress.   Neck: Supple. Cardiac: RRR. No murmurs, rubs, or gallops.  Respiratory: Clear to auscultation bilaterally. GI: Soft, non-tender, non-distended  MS: No lower extremity edema. No deformity. Skin: Warm and dry. Neuro:  No focal deficits. Psych: Normal affect. Responds appropriately.  Labs    High Sensitivity Troponin:   Recent Labs  Lab  05/12/20 0841 05/12/20 1443  TROPONINIHS 38* 36*      Chemistry Recent Labs  Lab 05/12/20 0345 05/13/20 0933 05/14/20 0323  NA 135 136 138  K 2.8* 3.5 3.3*  CL 101 102 104  CO2 23 24 24   GLUCOSE 119* 138* 109*  BUN 23* 29* 30*  CREATININE 2.33* 2.59* 2.57*  CALCIUM 8.9 9.3 8.8*  PROT 6.4*  --   --   ALBUMIN 3.5  --   --   AST 40  --   --   ALT 87*  --   --   ALKPHOS 67  --   --   BILITOT 1.5*  --   --   GFRNONAA 34* 30* 30*  GFRAA 39* 35* 35*  ANIONGAP 11 10 10      Hematology Recent Labs  Lab 05/11/20 1534 05/12/20 0345 05/13/20 0933  WBC 8.8 9.4 8.4  RBC 4.63 4.68 4.75  HGB 15.0 15.2 15.3  HCT 42.3 42.0 43.5  MCV 91.4 89.7 91.6  MCH 32.4 32.5 32.2  MCHC 35.5 36.2* 35.2  RDW 14.9 14.6 14.9  PLT 102* 97* 105*    BNP Recent Labs  Lab 05/11/20 1534  BNP 396.7*     DDimer No results for input(s): DDIMER in the last  168 hours.   Radiology    ECHOCARDIOGRAM COMPLETE  Result Date: 05/12/2020    ECHOCARDIOGRAM REPORT   Patient Name:   Sean Charles Date of Exam: 05/12/2020 Medical Rec #:  [id number]    Height:       70.0 in Accession #:    [id number]   Weight:       235.9 lb Date of Birth:  1980-10-28    BSA:          2.239 m Patient Age:    38 years     BP:           163/118 mmHg Patient Gender: M            HR:           94 bpm. Exam Location:  Inpatient Procedure: 2D Echo, Cardiac Doppler and Color Doppler Indications:    CHF-Acute Systolic 428.21 / I50.21  History:        Patient has no prior history of Echocardiogram examinations.                 Risk Factors:Former Smoker.  Sonographer:    Renella Cunas RDCS Referring Phys: [id number] Deno Lunger [facility] IMPRESSIONS  1. Left ventricular ejection fraction, by estimation, is 45 to 50%. The left ventricle has mildly decreased function. The left ventricle has no regional wall motion abnormalities. There is moderate left ventricular hypertrophy. Left ventricular diastolic parameters are consistent with Grade I diastolic  dysfunction (impaired relaxation).  2. Right ventricular systolic function is normal. The right ventricular size is normal. Tricuspid regurgitation signal is inadequate for assessing PA pressure.  3. The mitral valve is grossly normal. No evidence of mitral valve regurgitation. No evidence of mitral stenosis.  4. The aortic valve is tricuspid. Aortic valve regurgitation is not visualized. No aortic stenosis is present.  5. Aortic dilatation noted. There is mild dilatation of the aortic root measuring 40 mm.  6. The inferior vena cava is normal in size with greater than 50% respiratory variability, suggesting right atrial pressure of 3 mmHg. FINDINGS  Left Ventricle: Left ventricular ejection fraction, by estimation, is 45 to 50%. The left ventricle has mildly decreased function. The left ventricle has no regional wall motion abnormalities. The left ventricular internal cavity size was normal in size. There is moderate left ventricular hypertrophy. Left ventricular diastolic parameters are consistent with Grade I diastolic dysfunction (impaired relaxation). Right Ventricle: The right ventricular size is normal. No increase in right ventricular wall thickness. Right ventricular systolic function is normal. Tricuspid regurgitation signal is inadequate for assessing PA pressure. Left Atrium: Left atrial size was normal in size. Right Atrium: Right atrial size was normal in size. Pericardium: A small pericardial effusion is present. The pericardial effusion is circumferential. Mitral Valve: The mitral valve is grossly normal. No evidence of mitral valve regurgitation. No evidence of mitral valve stenosis. Tricuspid Valve: The tricuspid valve is grossly normal. Tricuspid valve regurgitation is not demonstrated. No evidence of tricuspid stenosis. Aortic Valve: The aortic valve is tricuspid. Aortic valve regurgitation is not visualized. No aortic stenosis is present. Pulmonic Valve: The pulmonic valve was grossly normal.  Pulmonic valve regurgitation is not visualized. No evidence of pulmonic stenosis. Aorta: Aortic dilatation noted. There is mild dilatation of the aortic root measuring 40 mm. Venous: The inferior vena cava is normal in size with greater than 50% respiratory variability, suggesting right atrial pressure of 3 mmHg. IAS/Shunts: The atrial septum is grossly normal.  LEFT VENTRICLE PLAX 2D  LVIDd:         5.30 cm      Diastology LVIDs:         3.90 cm      LV e' lateral:   6.15 cm/s LV PW:         1.46 cm      LV E/e' lateral: 8.7 LV IVS:        1.33 cm      LV e' medial:    5.40 cm/s LVOT diam:     2.30 cm      LV E/e' medial:  10.0 LV SV:         50 LV SV Index:   22 LVOT Area:     4.15 cm  LV Volumes (MOD) LV vol d, MOD A2C: 153.0 ml LV vol d, MOD A4C: 149.0 ml LV vol s, MOD A2C: 79.7 ml LV vol s, MOD A4C: 79.6 ml LV SV MOD A2C:     73.3 ml LV SV MOD A4C:     149.0 ml LV SV MOD BP:      71.2 ml RIGHT VENTRICLE RV S prime:     8.73 cm/s TAPSE (M-mode): 1.7 cm LEFT ATRIUM             Index       RIGHT ATRIUM           Index LA diam:        4.30 cm 1.92 cm/m  RA Area:     13.10 cm LA Vol (A2C):   72.3 ml 32.29 ml/m RA Volume:   32.70 ml  14.60 ml/m LA Vol (A4C):   59.8 ml 26.71 ml/m LA Biplane Vol: 67.3 ml 30.06 ml/m  AORTIC VALVE LVOT Vmax:   73.60 cm/s LVOT Vmean:  49.800 cm/s LVOT VTI:    0.120 m  AORTA Ao Root diam: 4.00 cm Ao Asc diam:  4.20 cm MITRAL VALVE MV Area (PHT): 5.50 cm    SHUNTS MV Decel Time: 138 msec    Systemic VTI:  0.12 m MV E velocity: 53.80 cm/s  Systemic Diam: 2.30 cm MV A velocity: 49.40 cm/s MV E/A ratio:  1.09 Lennie OdorWesley O'Neal MD Electronically signed by Lennie OdorWesley O'Neal MD Signature Date/Time: 05/12/2020/3:39:36 PM    Final     Cardiac Studies   Echocardiogram 05/12/2020: Impressions: 1. Left ventricular ejection fraction, by estimation, is 45 to 50%. The  left ventricle has mildly decreased function. The left ventricle has no  regional wall motion abnormalities. There is moderate left  ventricular  hypertrophy. Left ventricular  diastolic parameters are consistent with Grade I diastolic dysfunction  (impaired relaxation).  2. Right ventricular systolic function is normal. The right ventricular  size is normal. Tricuspid regurgitation signal is inadequate for assessing  PA pressure.  3. The mitral valve is grossly normal. No evidence of mitral valve  regurgitation. No evidence of mitral stenosis.  4. The aortic valve is tricuspid. Aortic valve regurgitation is not  visualized. No aortic stenosis is present.  5. Aortic dilatation noted. There is mild dilatation of the aortic root  measuring 40 mm.  6. The inferior vena cava is normal in size with greater than 50%  respiratory variability, suggesting right atrial pressure of 3 mmHg.  Patient Profile     39 y.o. male with no known significant past medical history prior to admission who is being seen for evaluation of new onset combined CHF in setting of hypertensive crisis at the request of Dr.  Akula.  Assessment & Plan    New Onset Acute Combined CHF - BNP 396.7.  - Chest x-ray showed possible bronchitis pattern but no consolidation or collapse. - Echo showed LVEF 45-50% with normal wall motion, moderate LVH, and grade 1 diastolic dysfunction. - Initially received IV Lasix. Last dose 05/12/2020. Net positive 802 mL this admission.  - Dyspnea has improved. Does not appear volume overloaded on exam. - No additional need for diuresis at this time. - Continue Coreg 25mg  twice daily.  - Continue Hydralazine 100mg  TID. - Losartan 25mg  daily started today by Nephrology. - Consider adding Imdur tomorrow. - Continue to monitor volume status closely. - Suspect mildly reduced EF is related to uncontrolled hypertension. Can repeat Echo once BP normalizes. If EF remains reduced, will needed to consider ischemic evaluation.   Hypertensive Crisis - BP 229/167 on arrival. Improved some but still very elevated. Last 3 BP  reading 156/103, 183/104, 186/120. Has not received morning medications yet. - Continue Amlodipine 10mg  daily and Coreg 25mg  twice daily. - Hydralazine increase to 100mg  TID today and Losartan 25mg  daily has been added by Nephrology. - Work-up for secondary causes of hypertension in process. Renal artery duplex pending. Possible hyperaldosteronism given HTN and hypokalemia. Aldosterone:renin ratio pending. Metanephrines pending.  Demand Ischemia - High-sensitivity troponin minimally elevated and flat at 38 >> 36. Not consistent with ACS. - EKG showed showed LV with T wave inversions in 1, aVL, and V6 but may be repolarization abnormality. - No angina. - Echo as above. - Suspect demand ischemia in setting of hypertensive crisis and acute CHF.  Hyperlipidemia - Lipid panel this admission: Total Cholesterol 224, Triglycerides 177, HDL 34, LDL 155.  - Will add Lipitor 40mg  daily. - Will need repeat lipid panel and LFT in 6-8 weeks.  AKI - Creatinine 2.34 on admission and 2.57 today. - Avoid Nephrotoxic agents.  - Continue to check daily BMET.  Hematuria - He has had 2 episodes of painless hematuria in the past. No recurrence in weeks. - Hemoglobin normal. - Follow-up with Urology post discharge has been recommended.     For questions or updates, please contact [facility] Please consult [email or web address] for contact info under     Signed, , PA-C  05/14/2020, 7:50 AM    The patient was seen, examined and discussed with , PA-C  and I agree with the above.   Patient stated that he feels better, he has improved shortness of breath, he just underwent renal ultrasound, results are pending, on physical exam he has no JVDs, S1-S2, no S4, lungs appear clear, and lower extremities are warm with no edema.  His blood pressure remains significantly elevated.  His carvedilol is at maximum dose, amlodipine to 10 mg, hydralazine was just increased 200 mg p.o. 3 times  daily, nephrology added losartan.  His creatinine remains elevated at 2.57 with GFR of 30.  I reviewed his echocardiogram that shows moderate to severe left ventricular hypertrophy with diffuse hypokinesis and LVEF around 40%.  His left atrial size is mildly increased, suggestive this is a chronic process.  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  , MD 05/14/2020

## 2020-05-14 NOTE — Discharge Instructions (Signed)
Hypertension, Adult Hypertension is another name for high blood pressure. High blood pressure forces your heart to work harder to pump blood. This can cause problems over time. There are two numbers in a blood pressure reading. There is a top number (systolic) over a bottom number (diastolic). It is best to have a blood pressure that is below 120/80. Healthy choices can help lower your blood pressure, or you may need medicine to help lower it. What are the causes? The cause of this condition is not known. Some conditions may be related to high blood pressure. What increases the risk?  Smoking.  Having type 2 diabetes mellitus, high cholesterol, or both.  Not getting enough exercise or physical activity.  Being overweight.  Having too much fat, sugar, calories, or salt (sodium) in your diet.  Drinking too much alcohol.  Having long-term (chronic) kidney disease.  Having a family history of high blood pressure.  Age. Risk increases with age.  Race. You may be at higher risk if you are African American.  Gender. Men are at higher risk than women before age 45. After age 65, women are at higher risk than men.  Having obstructive sleep apnea.  Stress. What are the signs or symptoms?  High blood pressure may not cause symptoms. Very high blood pressure (hypertensive crisis) may cause: ? Headache. ? Feelings of worry or nervousness (anxiety). ? Shortness of breath. ? Nosebleed. ? A feeling of being sick to your stomach (nausea). ? Throwing up (vomiting). ? Changes in how you see. ? Very bad chest pain. ? Seizures. How is this treated?  This condition is treated by making healthy lifestyle changes, such as: ? Eating healthy foods. ? Exercising more. ? Drinking less alcohol.  Your health care provider may prescribe medicine if lifestyle changes are not enough to get your blood pressure under control, and if: ? Your top number is above 130. ? Your bottom number is above  80.  Your personal target blood pressure may vary. Follow these instructions at home: Eating and drinking   If told, follow the DASH eating plan. To follow this plan: ? Fill one half of your plate at each meal with fruits and vegetables. ? Fill one fourth of your plate at each meal with whole grains. Whole grains include whole-wheat pasta, brown rice, and whole-grain bread. ? Eat or drink low-fat dairy products, such as skim milk or low-fat yogurt. ? Fill one fourth of your plate at each meal with low-fat (lean) proteins. Low-fat proteins include fish, chicken without skin, eggs, beans, and tofu. ? Avoid fatty meat, cured and processed meat, or chicken with skin. ? Avoid pre-made or processed food.  Eat less than 1,500 mg of salt each day.  Do not drink alcohol if: ? Your doctor tells you not to drink. ? You are pregnant, may be pregnant, or are planning to become pregnant.  If you drink alcohol: ? Limit how much you use to:  0-1 drink a day for women.  0-2 drinks a day for men. ? Be aware of how much alcohol is in your drink. In the [place] one drink equals one 12 oz bottle of beer (355 mL), one 5 oz glass of wine (148 mL), or one 1 oz glass of hard liquor (44 mL). Lifestyle   Work with your doctor to stay at a healthy weight or to lose weight. Ask your doctor what the best weight is for you.  Get at least 30 minutes of exercise most   days of the week. This may include walking, swimming, or biking.  Get at least 30 minutes of exercise that strengthens your muscles (resistance exercise) at least 3 days a week. This may include lifting weights or doing Pilates.  Do not use any products that contain nicotine or tobacco, such as cigarettes, e-cigarettes, and chewing tobacco. If you need help quitting, ask your doctor.  Check your blood pressure at home as told by your doctor.  Keep all follow-up visits as told by your doctor. This is important. Medicines  Take over-the-counter  and prescription medicines only as told by your doctor. Follow directions carefully.  Do not skip doses of blood pressure medicine. The medicine does not work as well if you skip doses. Skipping doses also puts you at risk for problems.  Ask your doctor about side effects or reactions to medicines that you should watch for. Contact a doctor if you:  Think you are having a reaction to the medicine you are taking.  Have headaches that keep coming back (recurring).  Feel dizzy.  Have swelling in your ankles.  Have trouble with your vision. Get help right away if you:  Get a very bad headache.  Start to feel mixed up (confused).  Feel weak or numb.  Feel faint.  Have very bad pain in your: ? Chest. ? Belly (abdomen).  Throw up more than once.  Have trouble breathing. Summary  Hypertension is another name for high blood pressure.  High blood pressure forces your heart to work harder to pump blood.  For most people, a normal blood pressure is less than 120/80.  Making healthy choices can help lower blood pressure. If your blood pressure does not get lower with healthy choices, you may need to take medicine. This information is not intended to replace advice given to you by your health care provider. Make sure you discuss any questions you have with your health care provider. Document Revised: 05/26/2018 Document Reviewed: 05/26/2018 Elsevier Patient Education  2020 [employer]   Managing Your Hypertension Hypertension is commonly called high blood pressure. This is when the force of your blood pressing against the walls of your arteries is too strong. Arteries are blood vessels that carry blood from your heart throughout your body. Hypertension forces the heart to work harder to pump blood, and may cause the arteries to become narrow or stiff. Having untreated or uncontrolled hypertension can cause heart attack, stroke, kidney disease, and other problems. What are blood  pressure readings? A blood pressure reading consists of a higher number over a lower number. Ideally, your blood pressure should be below 120/80. The first ("top") number is called the systolic pressure. It is a measure of the pressure in your arteries as your heart beats. The second ("bottom") number is called the diastolic pressure. It is a measure of the pressure in your arteries as the heart relaxes. What does my blood pressure reading mean? Blood pressure is classified into four stages. Based on your blood pressure reading, your health care provider may use the following stages to determine what type of treatment you need, if any. Systolic pressure and diastolic pressure are measured in a unit called mm Hg. Normal  Systolic pressure: below 120.  Diastolic pressure: below 80. Elevated  Systolic pressure: 120-129.  Diastolic pressure: below 80. Hypertension stage 1  Systolic pressure: 130-139.  Diastolic pressure: 80-89. Hypertension stage 2  Systolic pressure: 140 or above.  Diastolic pressure: 90 or above. What health risks are associated   with hypertension? Managing your hypertension is an important responsibility. Uncontrolled hypertension can lead to:  A heart attack.  A stroke.  A weakened blood vessel (aneurysm).  Heart failure.  Kidney damage.  Eye damage.  Metabolic syndrome.  Memory and concentration problems. What changes can I make to manage my hypertension? Hypertension can be managed by making lifestyle changes and possibly by taking medicines. Your health care provider will help you make a plan to bring your blood pressure within a normal range. Eating and drinking   Eat a diet that is high in fiber and potassium, and low in salt (sodium), added sugar, and fat. An example eating plan is called the DASH (Dietary Approaches to Stop Hypertension) diet. To eat this way: ? Eat plenty of fresh fruits and vegetables. Try to fill half of your plate at each  meal with fruits and vegetables. ? Eat whole grains, such as whole wheat pasta, brown rice, or whole grain bread. Fill about one quarter of your plate with whole grains. ? Eat low-fat diary products. ? Avoid fatty cuts of meat, processed or cured meats, and poultry with skin. Fill about one quarter of your plate with lean proteins such as fish, chicken without skin, beans, eggs, and tofu. ? Avoid premade and processed foods. These tend to be higher in sodium, added sugar, and fat.  Reduce your daily sodium intake. Most people with hypertension should eat less than 1,500 mg of sodium a day.  Limit alcohol intake to no more than 1 drink a day for nonpregnant women and 2 drinks a day for men. One drink equals 12 oz of beer, 5 oz of wine, or 1 oz of hard liquor. Lifestyle  Work with your health care provider to maintain a healthy body weight, or to lose weight. Ask what an ideal weight is for you.  Get at least 30 minutes of exercise that causes your heart to beat faster (aerobic exercise) most days of the week. Activities may include walking, swimming, or biking.  Include exercise to strengthen your muscles (resistance exercise), such as weight lifting, as part of your weekly exercise routine. Try to do these types of exercises for 30 minutes at least 3 days a week.  Do not use any products that contain nicotine or tobacco, such as cigarettes and e-cigarettes. If you need help quitting, ask your health care provider.  Control any long-term (chronic) conditions you have, such as high cholesterol or diabetes. Monitoring  Monitor your blood pressure at home as told by your health care provider. Your personal target blood pressure may vary depending on your medical conditions, your age, and other factors.  Have your blood pressure checked regularly, as often as told by your health care provider. Working with your health care provider  Review all the medicines you take with your health care  provider because there may be side effects or interactions.  Talk with your health care provider about your diet, exercise habits, and other lifestyle factors that may be contributing to hypertension.  Visit your health care provider regularly. Your health care provider can help you create and adjust your plan for managing hypertension. Will I need medicine to control my blood pressure? Your health care provider may prescribe medicine if lifestyle changes are not enough to get your blood pressure under control, and if:  Your systolic blood pressure is 130 or higher.  Your diastolic blood pressure is 80 or higher. Take medicines only as told by your health care provider. Follow   the directions carefully. Blood pressure medicines must be taken as prescribed. The medicine does not work as well when you skip doses. Skipping doses also puts you at risk for problems. Contact a health care provider if:  You think you are having a reaction to medicines you have taken.  You have repeated (recurrent) headaches.  You feel dizzy.  You have swelling in your ankles.  You have trouble with your vision. Get help right away if:  You develop a severe headache or confusion.  You have unusual weakness or numbness, or you feel faint.  You have severe pain in your chest or abdomen.  You vomit repeatedly.  You have trouble breathing. Summary  Hypertension is when the force of blood pumping through your arteries is too strong. If this condition is not controlled, it may put you at risk for serious complications.  Your personal target blood pressure may vary depending on your medical conditions, your age, and other factors. For most people, a normal blood pressure is less than 120/80.  Hypertension is managed by lifestyle changes, medicines, or both. Lifestyle changes include weight loss, eating a healthy, low-sodium diet, exercising more, and limiting alcohol. This information is not intended to  replace advice given to you by your health care provider. Make sure you discuss any questions you have with your health care provider. Document Revised: 01/07/2019 Document Reviewed: 08/13/2016 Elsevier Patient Education  2020 [employer] Inc.  

## 2020-05-14 NOTE — Progress Notes (Signed)
Nephrology Follow-Up Consult note   Assessment/Recommendations: Sean Charles is a/an 39 y.o. male with minimal past medical history, admitted for hypertensive urgency and elevated Crt.     Severe HTN: likely longstanding but severe and deserves secondary work up. Patient leaving before most of it can be completed outpatient. BP improved today after I started losartan. I think he may have hyperaldo based on the electrolyte abnormalities -Needs BMP before this week ends -I will arrange f/u in our clinic 2 weeks -Cont norvasc 10mg , coreg 25mg  BID, hydral 100mg  TID -Started losartan 25mg  daily -PVL RUS vs CTA outpt -Renin and aldo outpt  AKI vs CKD: UA and RUS reassuring. Maybe some from acutely elevated HTN but likely most CKD. Control BP and f/u outpt  Hypokalemia: replacing PRN. Needs labs this week. Losartan will help. Maybe aldactone after getting renin aldo. Consider thiazide if K improves.   Recommendations conveyed to primary service.    [facility] 05/14/2020 2:58 PM  ___________________________________________________________  CC: Hypertension, elevated creatinine  Interval History/Subjective: Blood pressure continues to be elevated today.  Patient denies any symptoms other than being frustrated with staying in the hospital.  Denies shortness of breath or chest pain.   Medications:  Current Facility-Administered Medications  Medication Dose Route Frequency Provider Last Rate Last Admin  . 0.9 %  sodium chloride infusion  250 mL Intravenous PRN Shalhoub, , MD      . acetaminophen (TYLENOL) tablet 650 mg  650 mg Oral Q4H PRN , MD   650 mg at 05/13/20 1022  . amLODipine (NORVASC) tablet 10 mg  10 mg Oral Daily Shalhoub, 05/16/2020, MD   10 mg at 05/14/20 0900  . atorvastatin (LIPITOR) tablet 40 mg  40 mg Oral Daily Marinda Elk E, PA-C   40 mg at 05/14/20 1025  . carvedilol (COREG) tablet 25 mg  25 mg Oral BID WC  Deno Lunger, MD   25 mg at 05/14/20 0855  . enoxaparin (LOVENOX) injection 40 mg  40 mg Subcutaneous Daily Shalhoub, Marjie Skiff, MD   40 mg at 05/14/20 0857  . hydrALAZINE (APRESOLINE) injection 10 mg  10 mg Intravenous Q6H PRN Delano Metz, MD   10 mg at 05/13/20 0418  . hydrALAZINE (APRESOLINE) tablet 100 mg  100 mg Oral Q8H Deno Lunger, MD   100 mg at 05/14/20 1335  . losartan (COZAAR) tablet 25 mg  25 mg Oral Daily Marinda Elk, MD   25 mg at 05/14/20 0857  . melatonin tablet 3 mg  3 mg Oral QHS PRN Darnell Level, MD   3 mg at 05/13/20 2129  . ondansetron (ZOFRAN) injection 4 mg  4 mg Intravenous Q6H PRN Shalhoub, 05/16/20, MD   4 mg at 05/12/20 1025  . polyethylene glycol (MIRALAX / GLYCOLAX) packet 17 g  17 g Oral Daily PRN Shalhoub, 05/15/20, MD      . sodium chloride flush (NS) 0.9 % injection 3 mL  3 mL Intravenous Q12H Shalhoub, 2130, MD   3 mL at 05/14/20 0857  . sodium chloride flush (NS) 0.9 % injection 3 mL  3 mL Intravenous PRN Shalhoub, 05/14/20, MD       Current Outpatient Medications  Medication Sig Dispense Refill  . [START ON 05/15/2020] amLODipine (NORVASC) 10 MG tablet Take 1 tablet (10 mg total) by mouth daily. 30 tablet 1  . [START ON 05/15/2020] atorvastatin (LIPITOR) 40 MG tablet Take 1 tablet (  40 mg total) by mouth daily. 30 tablet 1  . carvedilol (COREG) 25 MG tablet Take 1 tablet (25 mg total) by mouth 2 (two) times daily with a meal. 60 tablet 1  . hydrALAZINE (APRESOLINE) 100 MG tablet Take 1 tablet (100 mg total) by mouth every 8 (eight) hours. 300 tablet 1  . [START ON 05/15/2020] losartan (COZAAR) 25 MG tablet Take 1 tablet (25 mg total) by mouth daily. 30 tablet 1  . melatonin 3 MG TABS tablet Take 1 tablet (3 mg total) by mouth at bedtime as needed (sleep).  0  . polyethylene glycol (MIRALAX / GLYCOLAX) 17 g packet Take 17 g by mouth daily as needed for mild constipation. 14 each 0      Review of Systems: 10 systems reviewed and  negative except per interval history/subjective  Physical Exam: Vitals:   05/14/20 0745 05/14/20 1020  BP: (!) 186/120 (!) 141/96  Pulse: 87 78  Resp: 16 16  Temp: 97.7 F (36.5 C) 97.7 F (36.5 C)  SpO2: 96% 95%   Total I/O In: 240 [P.O.:240] Out: -   Intake/Output Summary (Last 24 hours) at 05/14/2020 1458 Last data filed at 05/14/2020 1100 Gross per 24 hour  Intake 240 ml  Output --  Net 240 ml   Constitutional: well-appearing, no acute distress ENMT: ears and nose without scars or lesions, MMM CV: normal rate, no edema Respiratory: Bilateral chest rise, normal work of breathing Gastrointestinal: soft, non-tender, no palpable masses or hernias Skin: no visible lesions or rashes Psych: alert, judgement/insight appropriate, appropriate mood and affect   Test Results I personally reviewed new and old clinical labs and radiology tests Lab Results  Component Value Date   NA 138 05/14/2020   K 3.3 (L) 05/14/2020   CL 104 05/14/2020   CO2 24 05/14/2020   BUN 30 (H) 05/14/2020   CREATININE 2.57 (H) 05/14/2020   CALCIUM 8.8 (L) 05/14/2020   ALBUMIN 3.5 05/12/2020

## 2020-05-15 ENCOUNTER — Emergency Department (HOSPITAL_COMMUNITY): Payer: BC Managed Care – PPO

## 2020-05-15 ENCOUNTER — Encounter (HOSPITAL_COMMUNITY): Payer: Self-pay | Admitting: Internal Medicine

## 2020-05-15 ENCOUNTER — Inpatient Hospital Stay (HOSPITAL_COMMUNITY): Payer: BC Managed Care – PPO

## 2020-05-15 ENCOUNTER — Inpatient Hospital Stay (HOSPITAL_COMMUNITY)
Admission: EM | Admit: 2020-05-15 | Discharge: 2020-05-17 | DRG: 064 | Disposition: A | Discharge status: Home / Self Care | Payer: BC Managed Care – PPO | Attending: Internal Medicine | Admitting: Internal Medicine

## 2020-05-15 ENCOUNTER — Other Ambulatory Visit: Payer: Self-pay

## 2020-05-15 DIAGNOSIS — I444 Left anterior fascicular block: Secondary | ICD-10-CM | POA: Diagnosis present

## 2020-05-15 DIAGNOSIS — I43 Cardiomyopathy in diseases classified elsewhere: Secondary | ICD-10-CM | POA: Diagnosis present

## 2020-05-15 DIAGNOSIS — I110 Hypertensive heart disease with heart failure: Secondary | ICD-10-CM | POA: Diagnosis present

## 2020-05-15 DIAGNOSIS — R4701 Aphasia: Secondary | ICD-10-CM | POA: Diagnosis present

## 2020-05-15 DIAGNOSIS — Z79890 Hormone replacement therapy (postmenopausal): Secondary | ICD-10-CM

## 2020-05-15 DIAGNOSIS — I130 Hypertensive heart and chronic kidney disease with heart failure and stage 1 through stage 4 chronic kidney disease, or unspecified chronic kidney disease: Secondary | ICD-10-CM | POA: Diagnosis present

## 2020-05-15 DIAGNOSIS — Z79899 Other long term (current) drug therapy: Secondary | ICD-10-CM

## 2020-05-15 DIAGNOSIS — Z20822 Contact with and (suspected) exposure to covid-19: Secondary | ICD-10-CM | POA: Diagnosis present

## 2020-05-15 DIAGNOSIS — I639 Cerebral infarction, unspecified: Secondary | ICD-10-CM | POA: Diagnosis not present

## 2020-05-15 DIAGNOSIS — Z87891 Personal history of nicotine dependence: Secondary | ICD-10-CM

## 2020-05-15 DIAGNOSIS — I6389 Other cerebral infarction: Secondary | ICD-10-CM | POA: Diagnosis present

## 2020-05-15 DIAGNOSIS — R29701 NIHSS score 1: Secondary | ICD-10-CM | POA: Diagnosis present

## 2020-05-15 DIAGNOSIS — N183 Chronic kidney disease, stage 3 unspecified: Secondary | ICD-10-CM

## 2020-05-15 DIAGNOSIS — I313 Pericardial effusion (noninflammatory): Secondary | ICD-10-CM | POA: Diagnosis present

## 2020-05-15 DIAGNOSIS — N1832 Chronic kidney disease, stage 3b: Secondary | ICD-10-CM | POA: Diagnosis present

## 2020-05-15 DIAGNOSIS — I161 Hypertensive emergency: Secondary | ICD-10-CM | POA: Diagnosis present

## 2020-05-15 DIAGNOSIS — I5032 Chronic diastolic (congestive) heart failure: Secondary | ICD-10-CM

## 2020-05-15 DIAGNOSIS — I509 Heart failure, unspecified: Secondary | ICD-10-CM

## 2020-05-15 DIAGNOSIS — Z8249 Family history of ischemic heart disease and other diseases of the circulatory system: Secondary | ICD-10-CM | POA: Diagnosis not present

## 2020-05-15 DIAGNOSIS — I5042 Chronic combined systolic (congestive) and diastolic (congestive) heart failure: Secondary | ICD-10-CM | POA: Diagnosis present

## 2020-05-15 DIAGNOSIS — I6783 Posterior reversible encephalopathy syndrome: Secondary | ICD-10-CM

## 2020-05-15 DIAGNOSIS — I169 Hypertensive crisis, unspecified: Secondary | ICD-10-CM | POA: Diagnosis present

## 2020-05-15 LAB — I-STAT CHEM 8, ED
BUN: 38 mg/dL — ABNORMAL HIGH (ref 6–20)
Calcium, Ion: 1.2 mmol/L (ref 1.15–1.40)
Chloride: 102 mmol/L (ref 98–111)
Creatinine, Ser: 2.5 mg/dL — ABNORMAL HIGH (ref 0.61–1.24)
Glucose, Bld: 103 mg/dL — ABNORMAL HIGH (ref 70–99)
HCT: 41 % (ref 39.0–52.0)
Hemoglobin: 13.9 g/dL (ref 13.0–17.0)
Potassium: 4 mmol/L (ref 3.5–5.1)
Sodium: 137 mmol/L (ref 135–145)
TCO2: 23 mmol/L (ref 22–32)

## 2020-05-15 LAB — COMPREHENSIVE METABOLIC PANEL
ALT: 52 U/L — ABNORMAL HIGH (ref 0–44)
AST: 25 U/L (ref 15–41)
Albumin: 3.4 g/dL — ABNORMAL LOW (ref 3.5–5.0)
Alkaline Phosphatase: 61 U/L (ref 38–126)
Anion gap: 8 (ref 5–15)
BUN: 35 mg/dL — ABNORMAL HIGH (ref 6–20)
CO2: 24 mmol/L (ref 22–32)
Calcium: 9 mg/dL (ref 8.9–10.3)
Chloride: 103 mmol/L (ref 98–111)
Creatinine, Ser: 2.56 mg/dL — ABNORMAL HIGH (ref 0.61–1.24)
GFR calc Af Amer: 35 mL/min — ABNORMAL LOW (ref >60)
GFR calc non Af Amer: 30 mL/min — ABNORMAL LOW (ref >60)
Glucose, Bld: 108 mg/dL — ABNORMAL HIGH (ref 70–99)
Potassium: 3.9 mmol/L (ref 3.5–5.1)
Sodium: 135 mmol/L (ref 135–145)
Total Bilirubin: 0.7 mg/dL (ref 0.3–1.2)
Total Protein: 6 g/dL — ABNORMAL LOW (ref 6.5–8.1)

## 2020-05-15 LAB — HEMOGLOBIN A1C
Hgb A1c MFr Bld: 4.6 % — ABNORMAL LOW (ref 4.8–5.6)
Mean Plasma Glucose: 85.32 mg/dL

## 2020-05-15 LAB — URINALYSIS, ROUTINE W REFLEX MICROSCOPIC
Bacteria, UA: NONE SEEN
Bilirubin Urine: NEGATIVE
Glucose, UA: 50 mg/dL — AB
Hgb urine dipstick: NEGATIVE
Ketones, ur: NEGATIVE mg/dL
Leukocytes,Ua: NEGATIVE
Nitrite: NEGATIVE
Protein, ur: 30 mg/dL — AB
Specific Gravity, Urine: 1.025 (ref 1.005–1.030)
pH: 6 (ref 5.0–8.0)

## 2020-05-15 LAB — CSF CELL COUNT WITH DIFFERENTIAL
RBC Count, CSF: 1 /mm3 — ABNORMAL HIGH
Tube #: 3
WBC, CSF: 1 /mm3 (ref 0–5)

## 2020-05-15 LAB — DIFFERENTIAL
Abs Immature Granulocytes: 0.07 10*3/uL (ref 0.00–0.07)
Basophils Absolute: 0.1 10*3/uL (ref 0.0–0.1)
Basophils Relative: 1 %
Eosinophils Absolute: 0.3 10*3/uL (ref 0.0–0.5)
Eosinophils Relative: 3 %
Immature Granulocytes: 1 %
Lymphocytes Relative: 25 %
Lymphs Abs: 2.3 10*3/uL (ref 0.7–4.0)
Monocytes Absolute: 0.8 10*3/uL (ref 0.1–1.0)
Monocytes Relative: 8 %
Neutro Abs: 5.8 10*3/uL (ref 1.7–7.7)
Neutrophils Relative %: 62 %

## 2020-05-15 LAB — C-REACTIVE PROTEIN: CRP: <0.5 mg/dL (ref <1.0)

## 2020-05-15 LAB — APTT: aPTT: 29 seconds (ref 24–36)

## 2020-05-15 LAB — CBC
HCT: 41.5 % (ref 39.0–52.0)
Hemoglobin: 14.1 g/dL (ref 13.0–17.0)
MCH: 31.7 pg (ref 26.0–34.0)
MCHC: 34 g/dL (ref 30.0–36.0)
MCV: 93.3 fL (ref 80.0–100.0)
Platelets: 131 10*3/uL — ABNORMAL LOW (ref 150–400)
RBC: 4.45 MIL/uL (ref 4.22–5.81)
RDW: 14.6 % (ref 11.5–15.5)
WBC: 9.2 10*3/uL (ref 4.0–10.5)
nRBC: 0 % (ref 0.0–0.2)

## 2020-05-15 LAB — RAPID URINE DRUG SCREEN, HOSP PERFORMED
Amphetamines: NOT DETECTED
Barbiturates: NOT DETECTED
Benzodiazepines: NOT DETECTED
Cocaine: NOT DETECTED
Opiates: NOT DETECTED
Tetrahydrocannabinol: NOT DETECTED

## 2020-05-15 LAB — ETHANOL: Alcohol, Ethyl (B): <10 mg/dL (ref <10)

## 2020-05-15 LAB — CBG MONITORING, ED: Glucose-Capillary: 102 mg/dL — ABNORMAL HIGH (ref 70–99)

## 2020-05-15 LAB — SEDIMENTATION RATE: Sed Rate: 9 mm/hr (ref 0–16)

## 2020-05-15 LAB — PROTEIN AND GLUCOSE, CSF
Glucose, CSF: 53 mg/dL (ref 40–70)
Total  Protein, CSF: 30 mg/dL (ref 15–45)

## 2020-05-15 LAB — SARS CORONAVIRUS 2 BY RT PCR (HOSPITAL ORDER, PERFORMED IN ~~LOC~~ HOSPITAL LAB): SARS Coronavirus 2: NEGATIVE

## 2020-05-15 LAB — PROTIME-INR
INR: 1 (ref 0.8–1.2)
Prothrombin Time: 13 seconds (ref 11.4–15.2)

## 2020-05-15 MED ORDER — STROKE: EARLY STAGES OF RECOVERY BOOK: Freq: Once | Status: DC

## 2020-05-15 MED ORDER — HYDRALAZINE HCL 25 MG PO TABS: 50.0000 mg | ORAL_TABLET | Freq: Two times a day (BID) | ORAL | Status: DC

## 2020-05-15 MED ORDER — ENOXAPARIN SODIUM 40 MG/0.4ML ~~LOC~~ SOLN
40.0000 mg | SUBCUTANEOUS | Status: DC
  Administered 2020-05-16: 40 mg via SUBCUTANEOUS
  Filled 2020-05-15 (×3): qty 0.4

## 2020-05-15 MED ORDER — ACETAMINOPHEN 650 MG RE SUPP: 650.0000 mg | RECTAL | Status: DC | PRN

## 2020-05-15 MED ORDER — ATORVASTATIN CALCIUM 80 MG PO TABS
80.0000 mg | ORAL_TABLET | Freq: Every day | ORAL | Status: DC
  Administered 2020-05-15 – 2020-05-17 (×3): 80 mg via ORAL
  Filled 2020-05-15 (×3): qty 1

## 2020-05-15 MED ORDER — CARVEDILOL 12.5 MG PO TABS
25.0000 mg | ORAL_TABLET | Freq: Two times a day (BID) | ORAL | Status: DC
  Administered 2020-05-15 – 2020-05-17 (×5): 25 mg via ORAL
  Filled 2020-05-15 (×5): qty 2
  Filled 2020-05-15: qty 8

## 2020-05-15 MED ORDER — IOHEXOL 350 MG/ML SOLN
80.0000 mL | Freq: Once | INTRAVENOUS | Status: AC | PRN
  Administered 2020-05-15: 75 mL via INTRAVENOUS

## 2020-05-15 MED ORDER — AMLODIPINE BESYLATE 10 MG PO TABS
10.0000 mg | ORAL_TABLET | Freq: Every day | ORAL | Status: DC
  Administered 2020-05-15 – 2020-05-17 (×3): 10 mg via ORAL
  Filled 2020-05-15: qty 1
  Filled 2020-05-15: qty 2
  Filled 2020-05-15: qty 1

## 2020-05-15 MED ORDER — HYDRALAZINE HCL 20 MG/ML IJ SOLN
10.0000 mg | INTRAMUSCULAR | Status: DC | PRN
  Filled 2020-05-15: qty 1

## 2020-05-15 MED ORDER — ASPIRIN 325 MG PO TABS
325.0000 mg | ORAL_TABLET | Freq: Every day | ORAL | Status: DC
  Administered 2020-05-15 – 2020-05-17 (×3): 325 mg via ORAL
  Filled 2020-05-15 (×4): qty 1

## 2020-05-15 MED ORDER — ACETAMINOPHEN 325 MG PO TABS: 650.0000 mg | ORAL_TABLET | ORAL | Status: DC | PRN

## 2020-05-15 MED ORDER — HYDROCHLOROTHIAZIDE 25 MG PO TABS: 25.0000 mg | ORAL_TABLET | Freq: Every day | ORAL | Status: DC

## 2020-05-15 MED ORDER — LOSARTAN POTASSIUM 25 MG PO TABS
25.0000 mg | ORAL_TABLET | Freq: Every day | ORAL | Status: DC
  Administered 2020-05-15 – 2020-05-17 (×3): 25 mg via ORAL
  Filled 2020-05-15 (×3): qty 1

## 2020-05-15 MED ORDER — ACETAMINOPHEN 160 MG/5ML PO SOLN: 650.0000 mg | ORAL | Status: DC | PRN

## 2020-05-15 NOTE — ED Notes (Signed)
MD, [place] notified pt passed swallow screen

## 2020-05-15 NOTE — H&P (Signed)
History and Physical    Sean Charles ZOX:[id number] DOB: 27-Nov-1980 DOA: 05/15/2020  PCP: Patient, No Pcp Per Patient coming from: Home  Chief Complaint: Code stroke  HPI: Sean Charles is a 39 y.o. male with medical history significant of chronic combined systolic and diastolic CHF, CKD stage III, hypertension presented to the ED for evaluation of slurred speech and word finding difficulty.  EMS noted elevated blood pressure in the 200s.  Patient was admitted to the hospital for hypertensive crisis and left yesterday.  History provided by patient and wife at bedside.  Wife states they left the hospital yesterday afternoon.  Today 8/17 around 12:30 AM patient woke up and his speech was slurred at that time.  He was having difficulty finding words.  She did not notice any facial droop.  Patient also complained of numbness of his right arm at that time.  No focal weakness reported.  His speech is now back to his baseline and he is no longer experiencing numbness in his right arm.  Patient has no other complaints.  Denies fevers, cough, shortness of breath, chest pain, nausea, vomiting, abdominal pain, or diarrhea.  ED Course: Blood pressure 170/106 upon arrival to the ED.  Remainder of vital signs stable.  Blood ethanol level undetectable.  UDS negative.  A1c 4.6. Patient was seen by neurology and TPA not given due to rapidly improving symptoms.  Head CT showing decrease white matter hypoattenuation throughout both cerebral hemispheres with focal area of hypoattenuation in the right cerebellum.  Findings most suggestive of PRES superimposed on chronic hypertensive white matter changes.  Superimposed right cerebellar infarct not excluded.  No acute hemorrhage.  CT angiogram head and neck without evidence of LVO.  Brain MRI pending.  Aspirin 325 mg and Lipitor 80 mg ordered.  Review of Systems:  All systems reviewed and apart from history of presenting illness, are negative.  Past Medical History:    Diagnosis Date  . Cardiomyopathy (HCC)   . Chronic kidney disease    elevated SCr during admit for HTN crisis (first recognized) 04/2020  . Combined systolic & diastolic CHF   . HTN (hypertension) 04/2020    Past Surgical History:  Procedure Laterality Date  . NO PAST SURGERIES       reports that he quit smoking about 5 years ago. He has never used smokeless tobacco. He reports current alcohol use of about 7.0 standard drinks of alcohol per week. He reports previous drug use. Drug: Cocaine.  No Known Allergies  Family History  Problem Relation Age of Onset  . Heart disease Maternal Uncle     Prior to Admission medications   Medication Sig Start Date End Date Taking? Authorizing Provider  carvedilol (COREG) 25 MG tablet Take 1 tablet (25 mg total) by mouth 2 (two) times daily with a meal. 05/14/20  Yes Kathlen Mody, MD  hydrALAZINE (APRESOLINE) 100 MG tablet Take 1 tablet (100 mg total) by mouth every 8 (eight) hours. 05/14/20  Yes Kathlen Mody, MD  melatonin 3 MG TABS tablet Take 1 tablet (3 mg total) by mouth at bedtime as needed (sleep). 05/14/20  Yes Kathlen Mody, MD  amLODipine (NORVASC) 10 MG tablet Take 1 tablet (10 mg total) by mouth daily. 05/15/20   Kathlen Mody, MD  atorvastatin (LIPITOR) 40 MG tablet Take 1 tablet (40 mg total) by mouth daily. 05/15/20   Kathlen Mody, MD  losartan (COZAAR) 25 MG tablet Take 1 tablet (25 mg total) by mouth daily. 05/15/20   Blake Divine,  Vijaya, MD  polyethylene glycol (MIRALAX / GLYCOLAX) 17 g packet Take 17 g by mouth daily as needed for mild constipation. 05/14/20   Kathlen Mody, MD    Physical Exam: Vitals:   05/15/20 0345 05/15/20 0400 05/15/20 0415 05/15/20 0445  BP: (!) 145/102 (!) 133/98 (!) 147/86 (!) 147/102  Pulse: 64 71 (!) 58 71  Resp: (!) 0 13 (!) 0 13  Temp:      TempSrc:      SpO2: 95% 97% (!) 88% 100%    Physical Exam Constitutional:      General: He is not in acute distress. HENT:     Head: Normocephalic and  atraumatic.     Mouth/Throat:     Mouth: Mucous membranes are moist.     Pharynx: Oropharynx is clear.  Eyes:     Extraocular Movements: Extraocular movements intact.     Conjunctiva/sclera: Conjunctivae normal.  Cardiovascular:     Rate and Rhythm: Normal rate and regular rhythm.     Pulses: Normal pulses.  Pulmonary:     Effort: Pulmonary effort is normal. No respiratory distress.     Breath sounds: Normal breath sounds. No wheezing or rales.  Abdominal:     General: Bowel sounds are normal. There is no distension.     Palpations: Abdomen is soft.     Tenderness: There is no abdominal tenderness. There is no guarding.  Musculoskeletal:        General: No swelling or tenderness.     Cervical back: Normal range of motion and neck supple.  Skin:    General: Skin is warm and dry.  Neurological:     General: No focal deficit present.     Mental Status: He is alert and oriented to person, place, and time.     Comments: Speech fluent, tongue midline, no facial droop Strength 5 out of 5 in bilateral upper and lower extremities.   Sensation to light touch intact throughout.     Labs on Admission: I have personally reviewed following labs and imaging studies  CBC: Recent Labs  Lab 05/11/20 1534 05/12/20 0345 05/13/20 0933 05/15/20 0132 05/15/20 0138  WBC 8.8 9.4 8.4 9.2  --   NEUTROABS 5.8 6.1 5.3 5.8  --   HGB 15.0 15.2 15.3 14.1 13.9  HCT 42.3 42.0 43.5 41.5 41.0  MCV 91.4 89.7 91.6 93.3  --   PLT 102* 97* 105* 131*  --    Basic Metabolic Panel: Recent Labs  Lab 05/11/20 1534 05/11/20 1534 05/12/20 0345 05/13/20 0933 05/14/20 0323 05/15/20 0132 05/15/20 0138  NA 137   < > 135 136 138 135 137  K 3.4*   < > 2.8* 3.5 3.3* 3.9 4.0  CL 102   < > 101 102 104 103 102  CO2 24  --  --   GLUCOSE 102*   < > 119* 138* 109* 108* 103*  BUN 30*   < > 23* 29* 30* 35* 38*  CREATININE 2.34*   < > 2.33* 2.59* 2.57* 2.56* 2.50*  CALCIUM 8.9  --  8.9 9.3 8.8* 9.0   --   MG  --   --  2.1  --   --   --   --    < > = values in this interval not displayed.   GFR: Estimated Creatinine Clearance: 49 mL/min (A) (by C-G formula based on SCr of 2.5 mg/dL (H)). Liver Function Tests: Recent Labs  Lab 05/12/20  0345 05/15/20 0132  AST 40 25  ALT 87* 52*  ALKPHOS 67 61  BILITOT 1.5* 0.7  PROT 6.4* 6.0*  ALBUMIN 3.5 3.4*   No results for input(s): LIPASE, AMYLASE in the last 168 hours. No results for input(s): AMMONIA in the last 168 hours. Coagulation Profile: Recent Labs  Lab 05/15/20 0132  INR 1.0   Cardiac Enzymes: No results for input(s): CKTOTAL, CKMB, CKMBINDEX, TROPONINI in the last 168 hours. BNP (last 3 results) No results for input(s): PROBNP in the last 8760 hours. HbA1C: Recent Labs    05/15/20 0132  HGBA1C 4.6*   CBG: Recent Labs  Lab 05/15/20 0123  GLUCAP 102*   Lipid Profile: Recent Labs    05/14/20 0323  CHOL 224*  HDL 34*  LDLCALC 155*  TRIG 177*  CHOLHDL 6.6   Thyroid Function Tests: No results for input(s): TSH, T4TOTAL, FREET4, T3FREE, THYROIDAB in the last 72 hours. Anemia Panel: No results for input(s): VITAMINB12, FOLATE, FERRITIN, TIBC, IRON, RETICCTPCT in the last 72 hours. Urine analysis:    Component Value Date/Time   COLORURINE YELLOW 05/15/2020 0202   APPEARANCEUR CLEAR 05/15/2020 0202   LABSPEC 1.025 05/15/2020 0202   PHURINE 6.0 05/15/2020 0202   GLUCOSEU 50 (A) 05/15/2020 0202   HGBUR NEGATIVE 05/15/2020 0202   BILIRUBINUR NEGATIVE 05/15/2020 0202   KETONESUR NEGATIVE 05/15/2020 0202   PROTEINUR 30 (A) 05/15/2020 0202   NITRITE NEGATIVE 05/15/2020 0202   LEUKOCYTESUR NEGATIVE 05/15/2020 0202    Radiological Exams on Admission: CT HEAD CODE STROKE WO CONTRAST  Result Date: 05/15/2020 CLINICAL DATA:  Code stroke.  Aphasia EXAM: CT HEAD WITHOUT CONTRAST TECHNIQUE: Contiguous axial images were obtained from the base of the skull through the vertex without intravenous contrast. COMPARISON:   None. FINDINGS: Brain: There is diffuse white matter hypoattenuation throughout both cerebral hemispheres. There is a focal area of hypoattenuation in the right cerebellum. No acute hemorrhage. No hydrocephalus. Vascular: No abnormal hyperdensity of the major intracranial arteries or dural venous sinuses. No intracranial atherosclerosis. Skull: The visualized skull base, calvarium and extracranial soft tissues are normal. Sinuses/Orbits: No fluid levels or advanced mucosal thickening of the visualized paranasal sinuses. No mastoid or middle ear effusion. The orbits are normal. ASPECTS [facility] Stroke Program Early CT Score) - Ganglionic level infarction (caudate, lentiform nuclei, internal capsule, insula, M1-M3 cortex): 7 - Supraganglionic infarction (M4-M6 cortex): 3 Total score (0-10 with 10 being normal): 10 IMPRESSION: 1. Diffuse white matter hypoattenuation throughout both cerebral hemispheres with focal area of hypoattenuation in the right cerebellum. The findings are most suggestive of PRES superimposed on chronic hypertensive white matter changes. A superimposed right cerebellar infarct would be difficult to exclude. 2. No acute hemorrhage. 3. ASPECTS is 10. These results were called by telephone at the time of interpretation on 05/15/2020 at 1:44 am to provider Dr. Marchelle Folks, Who verbally acknowledged these results. Electronically Signed   By: Deatra Robinson M.D.   On: 05/15/2020 01:45   VAS US RENAL ARTERY DUPLEX  Result Date: 05/14/2020 ABDOMINAL VISCERAL Indications: Hypertension Limitations: Air/bowel gas and patient discomfort. Comparison Study: No prior studies. Performing Technologist: Jean Rosenthal  Examination Guidelines: A complete evaluation includes B-mode imaging, spectral Doppler, color Doppler, and power Doppler as needed of all accessible portions of each vessel. Bilateral testing is considered an integral part of a complete examination. Limited examinations for reoccurring  indications may be performed as noted.  Duplex Findings: +--------------------+--------+--------+------+--------+ Mesenteric          PSV  cm/sEDV cm/sPlaqueComments +--------------------+--------+--------+------+--------+ Aorta Mid             134                          +--------------------+--------+--------+------+--------+ Celiac Artery Origin  104                          +--------------------+--------+--------+------+--------+ SMA Proximal           96      22                  +--------------------+--------+--------+------+--------+    +------------------+--------+--------+-------+ Right Renal ArteryPSV cm/sEDV cm/sComment +------------------+--------+--------+-------+ Origin              101      34           +------------------+--------+--------+-------+ Proximal             77      28           +------------------+--------+--------+-------+ Mid                  65      24           +------------------+--------+--------+-------+ Distal               57      22           +------------------+--------+--------+-------+ +-----------------+--------+--------+-------+ Left Renal ArteryPSV cm/sEDV cm/sComment +-----------------+--------+--------+-------+ Origin              49      17           +-----------------+--------+--------+-------+ Proximal            67      27           +-----------------+--------+--------+-------+ Mid                 64      26           +-----------------+--------+--------+-------+ Distal              63      19           +-----------------+--------+--------+-------+ +------------+--------+--------+----+-----------+--------+--------+----+ Right KidneyPSV cm/sEDV cm/sRI  Left KidneyPSV cm/sEDV cm/sRI   +------------+--------+--------+----+-----------+--------+--------+----+ Upper Pole  23      9       0.61Upper Pole 22      9       0.60  +------------+--------+--------+----+-----------+--------+--------+----+ Mid         27      12      0.        24      11      0.55 +------------+--------+--------+----+-----------+--------+--------+----+ Lower Pole  22      7       0.67Lower Pole 18      7       0.63 +------------+--------+--------+----+-----------+--------+--------+----+ Hilar       39      14      0.63Hilar      40      13      0.68 +------------+--------+--------+----+-----------+--------+--------+----+ +------------------+-----+------------------+-----+ Right Kidney           Left Kidney             +------------------+-----+------------------+-----+ RAR  RAR                     +------------------+-----+------------------+-----+ RAR (manual)           RAR (manual)            +------------------+-----+------------------+-----+ Cortex            0.58 Cortex            0.52  +------------------+-----+------------------+-----+ Cortex thickness       Corex thickness         +------------------+-----+------------------+-----+ Kidney length (cm)12.70Kidney length (cm)12.00 +------------------+-----+------------------+-----+  Summary: Renal:  Right: Normal right Resisitive Index. RRV flow present. Normal size        right kidney. Left:  Normal left Resistive Index. LRV flow present. Normal size of        left kidney.  *See table(s) above for measurements and observations.  Diagnosing physician: Sherald Hess MD  Electronically signed by Sherald Hess MD on 05/14/2020 at 3:38:14 PM.    Final    CT ANGIO HEAD CODE STROKE  Result Date: 05/15/2020 CLINICAL DATA:  Aphasia and hypertension EXAM: CT ANGIOGRAPHY HEAD AND NECK TECHNIQUE: Multidetector CT imaging of the head and neck was performed using the standard protocol during bolus administration of intravenous contrast. Multiplanar CT image reconstructions and MIPs were obtained to evaluate the vascular anatomy.  Carotid stenosis measurements (when applicable) are obtained utilizing NASCET criteria, using the distal internal carotid diameter as the denominator. CONTRAST:  75mL OMNIPAQUE IOHEXOL 350 MG/ML SOLN COMPARISON:  None. FINDINGS: CTA NECK FINDINGS SKELETON: There is no bony spinal canal stenosis. No lytic or blastic lesion. OTHER NECK: Normal pharynx, larynx and major salivary glands. No cervical lymphadenopathy. Unremarkable thyroid gland. UPPER CHEST: No pneumothorax or pleural effusion. No nodules or masses. AORTIC ARCH: There is no calcific atherosclerosis of the aortic arch. There is no aneurysm, dissection or hemodynamically significant stenosis of the visualized portion of the aorta. Conventional 3 vessel aortic branching pattern. The visualized proximal subclavian arteries are widely patent. RIGHT CAROTID SYSTEM: Normal without aneurysm, dissection or stenosis. LEFT CAROTID SYSTEM: Normal without aneurysm, dissection or stenosis. VERTEBRAL ARTERIES: Left dominant configuration. Both origins are clearly patent. There is no dissection, occlusion or flow-limiting stenosis to the skull base (V1-V3 segments). CTA HEAD FINDINGS POSTERIOR CIRCULATION: --Vertebral arteries: Normal V4 segments. --Inferior cerebellar arteries: Normal. --Basilar artery: Normal. --Superior cerebellar arteries: Normal. --Posterior cerebral arteries (PCA): Normal. ANTERIOR CIRCULATION: --Intracranial internal carotid arteries: Normal. --Anterior cerebral arteries (ACA): Normal. Both A1 segments are present. Patent anterior communicating artery (a-comm). --Middle cerebral arteries (MCA): Normal. VENOUS SINUSES: As permitted by contrast timing, patent. ANATOMIC VARIANTS: None Review of the MIP images confirms the above findings. IMPRESSION: Normal CTA of the head and neck. Electronically Signed   By: Deatra Robinson M.D.   On: 05/15/2020 01:57   CT ANGIO NECK CODE STROKE  Result Date: 05/15/2020 CLINICAL DATA:  Aphasia and hypertension  EXAM: CT ANGIOGRAPHY HEAD AND NECK TECHNIQUE: Multidetector CT imaging of the head and neck was performed using the standard protocol during bolus administration of intravenous contrast. Multiplanar CT image reconstructions and MIPs were obtained to evaluate the vascular anatomy. Carotid stenosis measurements (when applicable) are obtained utilizing NASCET criteria, using the distal internal carotid diameter as the denominator. CONTRAST:  75mL OMNIPAQUE IOHEXOL 350 MG/ML SOLN COMPARISON:  None. FINDINGS: CTA NECK FINDINGS SKELETON: There is no bony spinal canal stenosis. No lytic or blastic lesion. OTHER NECK: Normal pharynx, larynx and major salivary glands. No  cervical lymphadenopathy. Unremarkable thyroid gland. UPPER CHEST: No pneumothorax or pleural effusion. No nodules or masses. AORTIC ARCH: There is no calcific atherosclerosis of the aortic arch. There is no aneurysm, dissection or hemodynamically significant stenosis of the visualized portion of the aorta. Conventional 3 vessel aortic branching pattern. The visualized proximal subclavian arteries are widely patent. RIGHT CAROTID SYSTEM: Normal without aneurysm, dissection or stenosis. LEFT CAROTID SYSTEM: Normal without aneurysm, dissection or stenosis. VERTEBRAL ARTERIES: Left dominant configuration. Both origins are clearly patent. There is no dissection, occlusion or flow-limiting stenosis to the skull base (V1-V3 segments). CTA HEAD FINDINGS POSTERIOR CIRCULATION: --Vertebral arteries: Normal V4 segments. --Inferior cerebellar arteries: Normal. --Basilar artery: Normal. --Superior cerebellar arteries: Normal. --Posterior cerebral arteries (PCA): Normal. ANTERIOR CIRCULATION: --Intracranial internal carotid arteries: Normal. --Anterior cerebral arteries (ACA): Normal. Both A1 segments are present. Patent anterior communicating artery (a-comm). --Middle cerebral arteries (MCA): Normal. VENOUS SINUSES: As permitted by contrast timing, patent. ANATOMIC  VARIANTS: None Review of the MIP images confirms the above findings. IMPRESSION: Normal CTA of the head and neck. Electronically Signed   By: Deatra Robinson M.D.   On: 05/15/2020 01:57    EKG: Independently reviewed.  Sinus rhythm, LVH.  T wave abnormality in lateral leads similar to prior tracing.  Assessment/Plan Principal Problem:   PRES (posterior reversible encephalopathy syndrome) Active Problems:   Hypertensive crisis   Acute CVA (cerebrovascular accident) (HCC)   CHF (congestive heart failure) (HCC)   CKD (chronic kidney disease) stage 3, GFR 30-59 ml/min   PRES, Acute ischemic stroke versus TIA: Patient presented for evaluation of aphasia.  He has been seen by neurology and TPA not administered due to rapidly improving symptoms.  Head CT showing decrease white matter hypoattenuation throughout both cerebral hemispheres with focal area of hypoattenuation in the right cerebellum.  Findings most suggestive of PRES superimposed on chronic hypertensive white matter changes.  Superimposed right cerebellar infarct not excluded.  No acute hemorrhage.  CT angiogram head and neck without evidence of LVO.  -Telemetry monitoring -Neurology recommending goal SBP <180 given PRES and concern for stroke -MRI brain pending -Recent TTE done 8/14 with EF 45 to 50% -A1c 4.6 -LDL 155 on lipid panel done yesterday -Hemoglobin A1c, fasting lipid panel -Aspirin 325 p.o. now and daily -Atorvastatin 80 mg daily -Frequent neurochecks -PT, OT, speech therapy. -N.p.o. until cleared by bedside swallow evaluation or formal speech evaluation  Hypertensive crisis/ PRES: Work-up for secondary causes of hypertension was ordered during recent hospitalization. Renal artery duplex done yesterday showing normal bilateral restrictive index. Aldosterone: Renin ratio pending.  Metanephrines pending. -Neurology recommending goal SBP <180 given PRES and concern for stroke.  Continue home Coreg, amlodipine, and losartan.   IV Hydralazine PRN SBP >180.  Admit to the progressive care unit and monitor very closely.  Chronic combined systolic and diastolic CHF: No signs of volume overload at this time. -Continue Coreg and losartan.  CKD stage III: Creatinine 2.5, stable compared to labs done during recent hospitalization. -Continue to monitor  DVT prophylaxis: Lovenox Code Status: Full code Family Communication: No family available this time. Disposition Plan: Status is: Inpatient  Remains inpatient appropriate because:IV treatments appropriate due to intensity of illness or inability to take PO and Inpatient level of care appropriate due to severity of illness   Dispo: The patient is from: Home              Anticipated d/c is to: Home  Anticipated d/c date is: 3 days              Patient currently is not medically stable to d/c.  The medical decision making on this patient was of high complexity and the patient is at high risk for clinical deterioration, therefore this is a level 3 visit.  John GiovanniVasundhra Laneisha Mino MD [facility]  If 7PM-7AM, please contact night-coverage [email or web address]  05/15/2020, 5:21 AM

## 2020-05-15 NOTE — Procedures (Signed)
Indication: Vasculitis  Risks of the procedure were dicussed with the patient including post-LP headache, bleeding, infection, weakness/numbness of legs(radiculopathy), death.  The patient agreed and written consent was obtained.   The patient was prepped and draped, and using sterile technique a 20 gauge quinke spinal needle was inserted in the L4/L5 space. Marland Kitchen Approximately 16 cc of CSF were obtained and sent for analysis.  Only 1 attempt was made.  Patient handled the LP with no difficulties.  Felicie Morn PA-C [facility] [phone]  M-F  (9:00 am- 5:00 PM)  05/15/2020, 10:58 AM

## 2020-05-15 NOTE — Plan of Care (Signed)
°  Problem: Education: Goal: Knowledge of General Education information will improve Description: Including pain rating scale, medication(s)/side effects and non-pharmacologic comfort measures Outcome: Progressing   Problem: Health Behavior/Discharge Planning: Goal: Ability to manage health-related needs will improve Outcome: Progressing   Problem: Clinical Measurements: Goal: Ability to maintain clinical measurements within normal limits will improve Outcome: Progressing Goal: Will remain free from infection Outcome: Progressing Goal: Diagnostic test results will improve Outcome: Progressing Goal: Respiratory complications will improve Outcome: Progressing Goal: Cardiovascular complication will be avoided Outcome: Progressing   Problem: Activity: Goal: Risk for activity intolerance will decrease Outcome: Progressing   Problem: Nutrition: Goal: Adequate nutrition will be maintained Outcome: Progressing   Problem: Coping: Goal: Level of anxiety will decrease Outcome: Progressing   Problem: Elimination: Goal: Will not experience complications related to bowel motility Outcome: Progressing Goal: Will not experience complications related to urinary retention Outcome: Progressing   Problem: Pain Managment: Goal: General experience of comfort will improve Outcome: Progressing   Problem: Safety: Goal: Ability to remain free from injury will improve Outcome: Progressing   Problem: Skin Integrity: Goal: Risk for impaired skin integrity will decrease Outcome: Progressing   Problem: Education: Goal: Knowledge of disease or condition will improve Outcome: Progressing Goal: Knowledge of secondary prevention will improve Outcome: Progressing Goal: Knowledge of patient specific risk factors addressed and post discharge goals established will improve Outcome: Progressing   Problem: Self-Care: Goal: Ability to participate in self-care as condition permits will  improve Outcome: Progressing Goal: Verbalization of feelings and concerns over difficulty with self-care will improve Outcome: Progressing

## 2020-05-15 NOTE — Progress Notes (Signed)
PROGRESS NOTE    Sean Charles  ZDG:[id number] DOB: 1981-03-10 DOA: 05/15/2020 PCP: Patient, No Pcp Per    Brief Narrative:  Patient was admitted to the hospital with the working diagnosis of press syndrome, acute ischemic stroke/TIA.  39 year old male with past medical history for diastolic heart failure, chronic kidney disease stage III, and hypertension.  Patient presented an acute episode of slurred speech and difficulty finding words.  When EMS was called his systolic blood pressure was 200.  His symptoms were associated with numbness in his right arm.  His focal neurologic deficit was transitory.  On his initial physical examination blood pressure 170/106, heart rate 71, respiratory rate 13, oxygen saturation 97%, his lungs are clear to auscultation bilaterally, heart S1-S2, present rhythmic, soft abdomen, no lower extremity edema.  He had no focal neurologic deficits. Sodium 135, potassium 3.9, chloride-3, bicarb 24, glucose 108, BUN 35, creatinine 2.56, white count 9.2, hemoglobin 14.1, hematocrit 41.5, platelets 131.  SARS COVID-19 negative.  Urine analysis specific gravity 1.025, 30 protein.  0-5 white cells, 0-5 red cells.  Toxicology negative.  Chest radiograph no infiltrates, mild increased vascular markings. EKG 89 bpm, left axis deviation, left anterior fascicular block, normal intervals, sinus rhythm, J-point elevation V1 through V3, negative T wave lead I, aVL, V5 and V6, positive repolarization abnormalities and LVH.   Patient had recent hospitalization from 8/14-8/16, he was diagnosed with new onset acute diastolic heart failure in the setting of hypertensive emergency.   Assessment & Plan:   Principal Problem:   PRES (posterior reversible encephalopathy syndrome) Active Problems:   Hypertensive crisis   Acute CVA (cerebrovascular accident) (HCC)   CHF (congestive heart failure) (HCC)   CKD (chronic kidney disease) stage 3, GFR 30-59 ml/min   1. PRES syndrome/  hypertensive emergency. Neurologic deficit has improved, blood pressure this am is 135/99 mmHg with HR 77.  Brain MRI with unusual pattern of cerebral white matter and infratentorial signal abnormality with tiny acute matter infarcts.  CT angiography of the head and neck normal.  LP with 30 protein and 1 wbc, 1 rbc, 53 glucose.   Continue blood pressure control with carvedilol, amlodipine and losartan.  Currently on full dose aspirin and 80 mg of atorvastatin. Continue with as needed IV hydralazine for now.   Will follow with PT/OT/ and neurology recommendations.   2. CKD stage 3b/ suspected hypertensive nephropathy. Stable renal function with serum cr at 2,50 with K at 4,0 and serum bicarbonate at 24.   Continue blood pressure control. Follow up on renal function in am. Avoid nephrotoxic medications.  3. Chronic diastolic heart failure/ hypertensive cardiomyopathy. No clinical signs of exacerbation. Continue blood pressure control.     Patient continue to be at high risk for worsening hypertension and recurrent neurologic deficit.   Status is: Inpatient  Remains inpatient appropriate because:Inpatient level of care appropriate due to severity of illness   Dispo: The patient is from: Home              Anticipated d/c is to: Home              Anticipated d/c date is: 2 days              Patient currently is not medically stable to d/c.   DVT prophylaxis: Enoxaparin   Code Status:   full  Family Communication:  I spoke with patient's wife at the bedside, we talked in detail about patient's condition, plan of care and prognosis and all  questions were addressed.      Consultants:   Neurology   Procedures:   LP    Subjective: Patient is feeling better, no further focal neurologic deficit. No nausea or vomiting, no chest pain or dyspnea.   Objective: Vitals:   05/15/20 0945 05/15/20 1039 05/15/20 1132 05/15/20 1147  BP: (!) 129/91 (!) 139/100 (!) 139/95   Pulse: 75 68  67 78  Resp: 15 16 (!) 25 20  Temp:      TempSrc:      SpO2: 97% 99% 99% 95%   No intake or output data in the 24 hours ending 05/15/20 1307 There were no vitals filed for this visit.  Examination:   General: deconditioned  Neurology: Awake and alert, non focal  E ENT: no pallor, no icterus, oral mucosa moist Cardiovascular: No JVD. S1-S2 present, rhythmic, no gallops, rubs, or murmurs. No lower extremity edema. Pulmonary: positive breath sounds bilaterally, adequate air movement, no wheezing, rhonchi or rales. Gastrointestinal. Abdomen soft and non tender Musculoskeletal: no joint deformities     Data Reviewed: I have personally reviewed following labs and imaging studies  CBC: Recent Labs  Lab 05/11/20 1534 05/12/20 0345 05/13/20 0933 05/15/20 0132 05/15/20 0138  WBC 8.8 9.4 8.4 9.2  --   NEUTROABS 5.8 6.1 5.3 5.8  --   HGB 15.0 15.2 15.3 14.1 13.9  HCT 42.3 42.0 43.5 41.5 41.0  MCV 91.4 89.7 91.6 93.3  --   PLT 102* 97* 105* 131*  --    Basic Metabolic Panel: Recent Labs  Lab 05/11/20 1534 05/11/20 1534 05/12/20 0345 05/13/20 0933 05/14/20 0323 05/15/20 0132 05/15/20 0138  NA 137   < > 135 136 138 135 137  K 3.4*   < > 2.8* 3.5 3.3* 3.9 4.0  CL 102   < > 101 102 104 103 102  CO2 24  --  23 24 24 24   --   GLUCOSE 102*   < > 119* 138* 109* 108* 103*  BUN 30*   < > 23* 29* 30* 35* 38*  CREATININE 2.34*   < > 2.33* 2.59* 2.57* 2.56* 2.50*  CALCIUM 8.9  --  8.9 9.3 8.8* 9.0  --   MG  --   --  2.1  --   --   --   --    < > = values in this interval not displayed.   GFR: Estimated Creatinine Clearance: 49 mL/min (A) (by C-G formula based on SCr of 2.5 mg/dL (H)). Liver Function Tests: Recent Labs  Lab 05/12/20 0345 05/15/20 0132  AST 40 25  ALT 87* 52*  ALKPHOS 67 61  BILITOT 1.5* 0.7  PROT 6.4* 6.0*  ALBUMIN 3.5 3.4*   No results for input(s): LIPASE, AMYLASE in the last 168 hours. No results for input(s): AMMONIA in the last 168  hours. Coagulation Profile: Recent Labs  Lab 05/15/20 0132  INR 1.0   Cardiac Enzymes: No results for input(s): CKTOTAL, CKMB, CKMBINDEX, TROPONINI in the last 168 hours. BNP (last 3 results) No results for input(s): PROBNP in the last 8760 hours. HbA1C: Recent Labs    05/15/20 0132  HGBA1C 4.6*   CBG: Recent Labs  Lab 05/15/20 0123  GLUCAP 102*   Lipid Profile: Recent Labs    05/14/20 0323  CHOL 224*  HDL 34*  LDLCALC 155*  TRIG 177*  CHOLHDL 6.6   Thyroid Function Tests: No results for input(s): TSH, T4TOTAL, FREET4, T3FREE, THYROIDAB in the last 72 hours.  Anemia Panel: No results for input(s): VITAMINB12, FOLATE, FERRITIN, TIBC, IRON, RETICCTPCT in the last 72 hours.    Radiology Studies: I have reviewed all of the imaging during this hospital visit personally     Scheduled Meds: .  stroke: mapping our early stages of recovery book   Does not apply Once  . amLODipine  10 mg Oral Daily  . aspirin  325 mg Oral Daily  . atorvastatin  80 mg Oral Daily  . carvedilol  25 mg Oral BID WC  . enoxaparin (LOVENOX) injection  40 mg Subcutaneous Q24H  . losartan  25 mg Oral Daily   Continuous Infusions:   LOS: 0 days        Patrici Minnis Annett Gula, MD

## 2020-05-15 NOTE — ED Notes (Signed)
Ordered breakfast 

## 2020-05-15 NOTE — Progress Notes (Signed)
PT Cancellation Note  Patient Details Name: Zavion Sleight MRN: [id number] DOB: 26-Apr-1981   Cancelled Treatment:    Reason Eval/Treat Not Completed: Medical issues which prohibited therapy Pt continues to have elevated BP. Will hold until pt medically appropriate and follow up as schedule allows.   Farley Ly, PT, DPT  Acute Rehabilitation Services  Pager: [phone] Office: [phone]  Lehman Prom 05/15/2020, 6:01 PM

## 2020-05-15 NOTE — ED Provider Notes (Signed)
[facility] EMERGENCY DEPARTMENT Provider Note   CSN: [id number] Arrival date & time: 05/15/20  5638  An emergency department physician performed an initial assessment on this suspected stroke patient at 0120.  History Chief Complaint  Patient presents with  . Code Stroke    [name] is a 39 y.o. male.  HPI      Evaluated at arrival and cleared for CT at 0121.  This is a 39 year old male who presents as a code stroke.  He was discharged from the hospital yesterday after admission for hypertension.  He went to bed last night around 2230 and woke up at 1225 feeling weird.  He felt like his speech was slurred.  Wife noted that he was having difficulty finding his words.  He gradually seemed to improve somewhat but continued to have some difficulty with word finding.  Patient also reports that he felt like he had some right arm and leg weakness and numbness.  While he feels that he is still having some speech difficulty, he endorses that overall it is much better.  No history of stroke.  Per EMS, elevated blood pressure in the 200s.    Level 5 caveat for acuity of condition   Past Medical History:  Diagnosis Date  . Cardiomyopathy (HCC)   . Chronic kidney disease    elevated SCr during admit for HTN crisis (first recognized) 04/2020  . Combined systolic & diastolic CHF   . HTN (hypertension) 04/2020    Patient Active Problem List   Diagnosis Date Noted  . AKI (acute kidney injury) (HCC) 05/12/2020  . Acute on chronic congestive heart failure (HCC) 05/12/2020  . Hypertensive crisis 05/11/2020    Past Surgical History:  Procedure Laterality Date  . NO PAST SURGERIES         Family History  Problem Relation Age of Onset  . Heart disease Maternal Uncle     Social History   Tobacco Use  . Smoking status: Former Smoker    Quit date: 2016    Years since quitting: 5.6  . Smokeless tobacco: Never Used  Vaping Use  . Vaping Use: Never used    Substance Use Topics  . Alcohol use: Yes    Alcohol/week: 7.0 standard drinks    Types: 7 Cans of beer per week    Comment: daily  . Drug use: Not Currently    Types: Cocaine    Comment: No use in many years    Home Medications Prior to Admission medications   Medication Sig Start Date End Date Taking? Authorizing Provider  carvedilol (COREG) 25 MG tablet Take 1 tablet (25 mg total) by mouth 2 (two) times daily with a meal. 05/14/20  Yes Kathlen Mody, MD  hydrALAZINE (APRESOLINE) 100 MG tablet Take 1 tablet (100 mg total) by mouth every 8 (eight) hours. 05/14/20  Yes Kathlen Mody, MD  melatonin 3 MG TABS tablet Take 1 tablet (3 mg total) by mouth at bedtime as needed (sleep). 05/14/20  Yes Kathlen Mody, MD  amLODipine (NORVASC) 10 MG tablet Take 1 tablet (10 mg total) by mouth daily. 05/15/20   Kathlen Mody, MD  atorvastatin (LIPITOR) 40 MG tablet Take 1 tablet (40 mg total) by mouth daily. 05/15/20   Kathlen Mody, MD  losartan (COZAAR) 25 MG tablet Take 1 tablet (25 mg total) by mouth daily. 05/15/20   Kathlen Mody, MD  polyethylene glycol (MIRALAX / GLYCOLAX) 17 g packet Take 17 g by mouth daily as needed for  mild constipation. 05/14/20   Kathlen Mody, MD    Allergies    Patient has no known allergies.  Review of Systems   Review of Systems  Constitutional: Negative for fever.  Respiratory: Negative for shortness of breath.   Cardiovascular: Negative for chest pain.  Gastrointestinal: Negative for abdominal pain, nausea and vomiting.  Genitourinary: Negative for dysuria.  Neurological: Positive for speech difficulty and weakness. Negative for light-headedness and numbness.  All other systems reviewed and are negative.   Physical Exam Updated Vital Signs BP (!) 146/103   Pulse 85   Resp 17   SpO2 99%   Physical Exam Vitals and nursing note reviewed.  Constitutional:      Appearance: He is well-developed. He is not ill-appearing.  HENT:     Head: Normocephalic and  atraumatic.     Nose: Nose normal.     Mouth/Throat:     Mouth: Mucous membranes are moist.  Eyes:     Pupils: Pupils are equal, round, and reactive to light.  Cardiovascular:     Rate and Rhythm: Normal rate and regular rhythm.  Pulmonary:     Effort: Pulmonary effort is normal. No respiratory distress.  Abdominal:     Palpations: Abdomen is soft.     Tenderness: There is no abdominal tenderness.  Musculoskeletal:     Cervical back: Neck supple.     Right lower leg: No edema.     Left lower leg: No edema.  Lymphadenopathy:     Cervical: No cervical adenopathy.  Skin:    General: Skin is warm and dry.  Neurological:     Mental Status: He is alert and oriented to person, place, and time.     Comments: Cranial nerves II through XII intact, 5 out of 5 strength in all 4 extremities, no dysmetria to finger-nose-finger, no drift, patient can name and repeat  Psychiatric:        Mood and Affect: Mood normal.     ED Results / Procedures / Treatments   Labs (all labs ordered are listed, but only abnormal results are displayed) Labs Reviewed  CBC - Abnormal; Notable for the following components:      Result Value   Platelets 131 (*)    All other components within normal limits  COMPREHENSIVE METABOLIC PANEL - Abnormal; Notable for the following components:   Glucose, Bld 108 (*)    BUN 35 (*)    Creatinine, Ser 2.56 (*)    Total Protein 6.0 (*)    Albumin 3.4 (*)    ALT 52 (*)    GFR calc non Af Amer 30 (*)    GFR calc Af Amer 35 (*)    All other components within normal limits  URINALYSIS, ROUTINE W REFLEX MICROSCOPIC - Abnormal; Notable for the following components:   Glucose, UA 50 (*)    Protein, ur 30 (*)    All other components within normal limits  I-STAT CHEM 8, ED - Abnormal; Notable for the following components:   BUN 38 (*)    Creatinine, Ser 2.50 (*)    Glucose, Bld 103 (*)    All other components within normal limits  CBG MONITORING, ED - Abnormal; Notable  for the following components:   Glucose-Capillary 102 (*)    All other components within normal limits  ETHANOL  PROTIME-INR  APTT  DIFFERENTIAL  RAPID URINE DRUG SCREEN, HOSP PERFORMED  HEMOGLOBIN A1C    EKG None  Radiology CT HEAD CODE STROKE WO  CONTRAST  Result Date: 05/15/2020 CLINICAL DATA:  Code stroke.  Aphasia EXAM: CT HEAD WITHOUT CONTRAST TECHNIQUE: Contiguous axial images were obtained from the base of the skull through the vertex without intravenous contrast. COMPARISON:  None. FINDINGS: Brain: There is diffuse white matter hypoattenuation throughout both cerebral hemispheres. There is a focal area of hypoattenuation in the right cerebellum. No acute hemorrhage. No hydrocephalus. Vascular: No abnormal hyperdensity of the major intracranial arteries or dural venous sinuses. No intracranial atherosclerosis. Skull: The visualized skull base, calvarium and extracranial soft tissues are normal. Sinuses/Orbits: No fluid levels or advanced mucosal thickening of the visualized paranasal sinuses. No mastoid or middle ear effusion. The orbits are normal. ASPECTS [facility] Stroke Program Early CT Score) - Ganglionic level infarction (caudate, lentiform nuclei, internal capsule, insula, M1-M3 cortex): 7 - Supraganglionic infarction (M4-M6 cortex): 3 Total score (0-10 with 10 being normal): 10 IMPRESSION: 1. Diffuse white matter hypoattenuation throughout both cerebral hemispheres with focal area of hypoattenuation in the right cerebellum. The findings are most suggestive of PRES superimposed on chronic hypertensive white matter changes. A superimposed right cerebellar infarct would be difficult to exclude. 2. No acute hemorrhage. 3. ASPECTS is 10. These results were called by telephone at the time of interpretation on 05/15/2020 at 1:44 am to provider Dr. Marchelle Folks, Who verbally acknowledged these results. Electronically Signed   By: Deatra Robinson M.D.   On: 05/15/2020 01:45   VAS US RENAL  ARTERY DUPLEX  Result Date: 05/14/2020 ABDOMINAL VISCERAL Indications: Hypertension Limitations: Air/bowel gas and patient discomfort. Comparison Study: No prior studies. Performing Technologist: Jean Rosenthal  Examination Guidelines: A complete evaluation includes B-mode imaging, spectral Doppler, color Doppler, and power Doppler as needed of all accessible portions of each vessel. Bilateral testing is considered an integral part of a complete examination. Limited examinations for reoccurring indications may be performed as noted.  Duplex Findings: +--------------------+--------+--------+------+--------+ Mesenteric          PSV cm/sEDV cm/sPlaqueComments +--------------------+--------+--------+------+--------+ Aorta Mid             134                          +--------------------+--------+--------+------+--------+ Celiac Artery Origin  104                          +--------------------+--------+--------+------+--------+ SMA Proximal           96      22                  +--------------------+--------+--------+------+--------+    +------------------+--------+--------+-------+ Right Renal ArteryPSV cm/sEDV cm/sComment +------------------+--------+--------+-------+ Origin              101      34           +------------------+--------+--------+-------+ Proximal             77      28           +------------------+--------+--------+-------+ Mid                  65      24           +------------------+--------+--------+-------+ Distal               57      22           +------------------+--------+--------+-------+ +-----------------+--------+--------+-------+ Left Renal ArteryPSV cm/sEDV cm/sComment +-----------------+--------+--------+-------+ Origin  49      17           +-----------------+--------+--------+-------+ Proximal            67      27           +-----------------+--------+--------+-------+ Mid                 64      26            +-----------------+--------+--------+-------+ Distal              63      19           +-----------------+--------+--------+-------+ +------------+--------+--------+----+-----------+--------+--------+----+ Right KidneyPSV cm/sEDV cm/sRI  Left KidneyPSV cm/sEDV cm/sRI   +------------+--------+--------+----+-----------+--------+--------+----+ Upper Pole  23      9       0.61Upper Pole 22      9       0.60 +------------+--------+--------+----+-----------+--------+--------+----+ Mid         27      12      0.        24      11      0.55 +------------+--------+--------+----+-----------+--------+--------+----+ Lower Pole  22      7       0.67Lower Pole 18      7       0.63 +------------+--------+--------+----+-----------+--------+--------+----+ Hilar       39      14      0.63Hilar      40      13      0.68 +------------+--------+--------+----+-----------+--------+--------+----+ +------------------+-----+------------------+-----+ Right Kidney           Left Kidney             +------------------+-----+------------------+-----+ RAR                    RAR                     +------------------+-----+------------------+-----+ RAR (manual)           RAR (manual)            +------------------+-----+------------------+-----+ Cortex            0.58 Cortex            0.52  +------------------+-----+------------------+-----+ Cortex thickness       Corex thickness         +------------------+-----+------------------+-----+ Kidney length (cm)12.70Kidney length (cm)12.00 +------------------+-----+------------------+-----+  Summary: Renal:  Right: Normal right Resisitive Index. RRV flow present. Normal size        right kidney. Left:  Normal left Resistive Index. LRV flow present. Normal size of        left kidney.  *See table(s) above for measurements and observations.  Diagnosing physician: Sherald Hess MD  Electronically signed by  Sherald Hess MD on 05/14/2020 at 3:38:14 PM.    Final    CT ANGIO HEAD CODE STROKE  Result Date: 05/15/2020 CLINICAL DATA:  Aphasia and hypertension EXAM: CT ANGIOGRAPHY HEAD AND NECK TECHNIQUE: Multidetector CT imaging of the head and neck was performed using the standard protocol during bolus administration of intravenous contrast. Multiplanar CT image reconstructions and MIPs were obtained to evaluate the vascular anatomy. Carotid stenosis measurements (when applicable) are obtained utilizing NASCET criteria, using the distal internal carotid diameter as the denominator. CONTRAST:  32mL OMNIPAQUE IOHEXOL 350 MG/ML SOLN COMPARISON:  None. FINDINGS: CTA NECK FINDINGS SKELETON: There is no bony spinal canal  stenosis. No lytic or blastic lesion. OTHER NECK: Normal pharynx, larynx and major salivary glands. No cervical lymphadenopathy. Unremarkable thyroid gland. UPPER CHEST: No pneumothorax or pleural effusion. No nodules or masses. AORTIC ARCH: There is no calcific atherosclerosis of the aortic arch. There is no aneurysm, dissection or hemodynamically significant stenosis of the visualized portion of the aorta. Conventional 3 vessel aortic branching pattern. The visualized proximal subclavian arteries are widely patent. RIGHT CAROTID SYSTEM: Normal without aneurysm, dissection or stenosis. LEFT CAROTID SYSTEM: Normal without aneurysm, dissection or stenosis. VERTEBRAL ARTERIES: Left dominant configuration. Both origins are clearly patent. There is no dissection, occlusion or flow-limiting stenosis to the skull base (V1-V3 segments). CTA HEAD FINDINGS POSTERIOR CIRCULATION: --Vertebral arteries: Normal V4 segments. --Inferior cerebellar arteries: Normal. --Basilar artery: Normal. --Superior cerebellar arteries: Normal. --Posterior cerebral arteries (PCA): Normal. ANTERIOR CIRCULATION: --Intracranial internal carotid arteries: Normal. --Anterior cerebral arteries (ACA): Normal. Both A1 segments are present.  Patent anterior communicating artery (a-comm). --Middle cerebral arteries (MCA): Normal. VENOUS SINUSES: As permitted by contrast timing, patent. ANATOMIC VARIANTS: None Review of the MIP images confirms the above findings. IMPRESSION: Normal CTA of the head and neck. Electronically Signed   By: Deatra RobinsonKevin  Herman M.D.   On: 05/15/2020 01:57   CT ANGIO NECK CODE STROKE  Result Date: 05/15/2020 CLINICAL DATA:  Aphasia and hypertension EXAM: CT ANGIOGRAPHY HEAD AND NECK TECHNIQUE: Multidetector CT imaging of the head and neck was performed using the standard protocol during bolus administration of intravenous contrast. Multiplanar CT image reconstructions and MIPs were obtained to evaluate the vascular anatomy. Carotid stenosis measurements (when applicable) are obtained utilizing NASCET criteria, using the distal internal carotid diameter as the denominator. CONTRAST:  75mL OMNIPAQUE IOHEXOL 350 MG/ML SOLN COMPARISON:  None. FINDINGS: CTA NECK FINDINGS SKELETON: There is no bony spinal canal stenosis. No lytic or blastic lesion. OTHER NECK: Normal pharynx, larynx and major salivary glands. No cervical lymphadenopathy. Unremarkable thyroid gland. UPPER CHEST: No pneumothorax or pleural effusion. No nodules or masses. AORTIC ARCH: There is no calcific atherosclerosis of the aortic arch. There is no aneurysm, dissection or hemodynamically significant stenosis of the visualized portion of the aorta. Conventional 3 vessel aortic branching pattern. The visualized proximal subclavian arteries are widely patent. RIGHT CAROTID SYSTEM: Normal without aneurysm, dissection or stenosis. LEFT CAROTID SYSTEM: Normal without aneurysm, dissection or stenosis. VERTEBRAL ARTERIES: Left dominant configuration. Both origins are clearly patent. There is no dissection, occlusion or flow-limiting stenosis to the skull base (V1-V3 segments). CTA HEAD FINDINGS POSTERIOR CIRCULATION: --Vertebral arteries: Normal V4 segments. --Inferior  cerebellar arteries: Normal. --Basilar artery: Normal. --Superior cerebellar arteries: Normal. --Posterior cerebral arteries (PCA): Normal. ANTERIOR CIRCULATION: --Intracranial internal carotid arteries: Normal. --Anterior cerebral arteries (ACA): Normal. Both A1 segments are present. Patent anterior communicating artery (a-comm). --Middle cerebral arteries (MCA): Normal. VENOUS SINUSES: As permitted by contrast timing, patent. ANATOMIC VARIANTS: None Review of the MIP images confirms the above findings. IMPRESSION: Normal CTA of the head and neck. Electronically Signed   By: Deatra RobinsonKevin  Herman M.D.   On: 05/15/2020 01:57    Procedures Procedures (including critical care time)  Medications Ordered in ED Medications  aspirin tablet 325 mg (325 mg Oral Given 05/15/20 0248)  atorvastatin (LIPITOR) tablet 80 mg (has no administration in time range)  iohexol (OMNIPAQUE) 350 MG/ML injection 80 mL (75 mLs Intravenous Contrast Given 05/15/20 0139)    ED Course  I have reviewed the triage vital signs and the nursing notes.  Pertinent labs & imaging results that were available during  my care of the patient were reviewed by me and considered in my medical decision making (see chart for details).    MDM Rules/Calculators/A&P                           Presents with aphasia.  Improvement in route.  Recent admission for hypertension and evaluation for secondary causes.  Neuro exam is fairly benign although he does appear to have some subjective word finding difficulty.  Patient was evaluated by neurology.  Sent to the CT scanner.  NIH of 1.  TPA was not given.  CT scan had some suggestion of hypertensive changes and PRES.  Patient is without headache.  However, recent history of significant hypertension.  Blood pressures have down trended to 146/103.  Given concern for possible acute stroke in the setting of press, will aim for blood pressure less than 180 systolic.  No indication for IV infusion at this time.   Neurology recommending MRI brain.  See full note for details.  We will plan for readmission to the hospitalist service.  Final Clinical Impression(s) / ED Diagnoses Final diagnoses:  Aphasia  PRES (posterior reversible encephalopathy syndrome)    Rx / DC Orders ED Discharge Orders    None       Fathima Bartl, Mayer Masker, MD 05/15/20 [phone]

## 2020-05-15 NOTE — ED Triage Notes (Signed)
Pt presents to ED BIB [facility] as CODE STROKE. Pt c/o expressive aphasia. Per EMS pt originally had gargled speech, upon their arrival pt could not finish sentences. Pt LSN - 2230. Initial NIH - 1.

## 2020-05-15 NOTE — Progress Notes (Signed)
LP was performed today secondary to clinical picture consistent with possible vasculitis.  LP fluid to be sent to the lab to look for pleocytosis.  16 cc were obtained in case possible other tests will need to be obtained.  Blood pressure at this time has been well controlled.  Felicie Morn PA-C [facility] [phone]  M-F  (9:00 am- 5:00 PM)  05/15/2020, 11:06 AM

## 2020-05-15 NOTE — Code Documentation (Signed)
Responded to Code Stroke called at 0105 for aphasia, LSN-2230. Pt arrived at 0117, CBG-103, NIH-1 for aphasia, CT head negative for acute changes. CTA-no LVO. Plan to admit for stroke work-up.

## 2020-05-15 NOTE — ED Notes (Signed)
Dinner ordered 

## 2020-05-15 NOTE — Consult Note (Addendum)
NEUROLOGY CONSULTATION NOTE   Date of service: May 15, 2020 Patient Name: Sean Charles MRN:  [id number] DOB:  [birth year] Reason for consult: "Stroke code"  History of Present Illness  Sean Charles is a 39 y.o. male with PMH significant for HTN, Cardiomyopathy,  CKD who presents with acute onset aphasia.  Patient was admitted to the hospital recently for better hypertension control and for work-up for secondary cause of hypertension.  It appears that he left AMA from the hospital earlier today.  He reports he went and got groceries after discharge and was doing pretty good.  He went to bed at 2230 on 05/14/2020 and woke up at 1225 on 05/15/2020 with feeling weird and just could not talk.  Per wife, she reported the patient reported feeling very overwhelmed and he went to the bathroom and nothing was coming out when he was attempting to talk.  She describes this as feeling like of Blowing bubbles.  She got a cold washcloth but he still did not improve.  He was unable to answer any questions with her wife so wife called 911.  She reported that eventually he was able to repeat sentences for her albeit with a lot of difficulty and got it in correct the first 2-3 times.  Patient reports that in his sleep, he felt that he could not move his right side or when he woke up he was not weak in his right arm or right leg.  He endorses that his speech has significantly improved.  He was able to name simple objects for me but did have some difficulty with words when trying to formulate complex ideas.  He denies any arm or leg weakness or numbness, no facial droop, no dysarthria, no visual loss or field cut.  SBP per EMS was elevated to 200s with a normal blood glucose.  However, here his blood pressure was much better and down to 170s to 160 systolic.  NIH stroke scale of 1 Modified Rankin score of 0 Last known well 2230 on 05/14/2020. TPA: Was not given due to rapidly improving symptoms. Thrombectomy: No  LVO, not a candidate for thrombectomy.   ROS   Constitutional Denies weight loss, fever and chills.  HEENT Denies changes in vision and hearing.  Respiratory Denies SOB and cough.  CV Denies palpitations and CP  GI Denies abdominal pain, nausea, vomiting and diarrhea.  GU Denies dysuria and urinary frequency.  MSK Denies myalgia and joint pain.  Skin Denies rash and pruritus.  Neurological Denies headache and syncope.  Psychiatric Denies recent changes in mood. Denies anxiety and depression.   Past History   Past Medical History:  Diagnosis Date  . Cardiomyopathy (HCC)   . Chronic kidney disease    elevated SCr during admit for HTN crisis (first recognized) 04/2020  . Combined systolic & diastolic CHF   . HTN (hypertension) 04/2020   Past Surgical History:  Procedure Laterality Date  . NO PAST SURGERIES     Family History  Problem Relation Age of Onset  . Heart disease Maternal Uncle    Social History   Socioeconomic History  . Marital status: Married    Spouse name: Not on file  . Number of children: 1  . Years of education: Not on file  . Highest education level: Not on file  Occupational History  . Occupation: Public affairs consultant  Tobacco Use  . Smoking status: Former Smoker    Quit date: 2016    Years since quitting: 5.6  .  Smokeless tobacco: Never Used  Vaping Use  . Vaping Use: Never used  Substance and Sexual Activity  . Alcohol use: Yes    Alcohol/week: 7.0 standard drinks    Types: 7 Cans of beer per week    Comment: daily  . Drug use: Not Currently    Types: Cocaine    Comment: No use in many years  . Sexual activity: Not on file  Other Topics Concern  . Not on file  Social History Narrative   Former Conservation officer, [employer]   Social Determinants of Health   Financial Resource Strain:   . Difficulty of Paying Living Expenses:   Food Insecurity:   . Worried About Programme researcher, broadcasting/film/videounning Out of Food in the Last Year:   . Baristaan Out of Food in the Last Year:   Transportation  Needs:   . Freight forwarderLack of Transportation (Medical):   Marland Kitchen. Lack of Transportation (Non-Medical):   Physical Activity:   . Days of Exercise per Week:   . Minutes of Exercise per Session:   Stress:   . Feeling of Stress :   Social Connections:   . Frequency of Communication with Friends and Family:   . Frequency of Social Gatherings with Friends and Family:   . Attends Religious Services:   . Active Member of Clubs or Organizations:   . Attends BankerClub or Organization Meetings:   Marland Kitchen. Marital Status:    No Known Allergies  Medications  (Not in a hospital admission)    Vitals  Temp:  [97.7 F (36.5 C)-98.6 F (37 C)] 97.7 F (36.5 C) (08/16 1020) Pulse Rate:  [78-93] 93 (08/17 0152) Resp:  [11-20] 15 (08/17 0152) BP: (141-186)/(96-120) 166/108 (08/17 0145) SpO2:  [95 %-100 %] 100 % (08/17 0152) Weight:  [109 kg] 109 kg (08/16 0449)  There is no height or weight on file to calculate BMI.  Physical Exam   General: Laying comfortably in bed; in no acute distress.  HENT: Normal oropharynx and mucosa. Normal external appearance of ears and nose. Neck: Supple, no pain or tenderness CV: No JVD. No peripheral edema. Pulmonary: Symmetric Chest rise. Normal respiratory effort. Abdomen: Soft to touch, non-tender Ext: No cyanosis, edema, or deformity  Skin: No rash. Normal palpation of skin.   Musculoskeletal: Normal digits and nails by inspection. No clubbing.  Neurologic Examination  Mental status/Cognition: Alert, oriented to self, place, month and year, good attention. Speech/language: Fluent, comprehension intact, object naming intact, repetition intact.  Does appear to have some mild aphasia when expressing complex ideas but this significantly improved after the CT scans. Cranial nerves:   CN II Pupils equal and reactive to light, no VF deficits   CN III,IV,VI EOM intact, no gaze preference or deviation, no nystagmus   CN V normal sensation in V1, V2, and V3 segments bilaterally   CN VII  no asymmetry, no nasolabial fold flattening   CN VIII normal hearing to speech   CN IX & X normal palatal elevation, no uvular deviation   CN XI 5/5 head turn and 5/5 shoulder shrug bilaterally   CN XII midline tongue protrusion   Motor:  Muscle bulk: normal, tone: normal, pronator drift none Mvmt Root Nerve  Muscle Right Left Comments  SA C5/6 Ax Deltoid 5 5   EF C5/6 Mc Biceps 5 5   EE C6/7/8 Rad Triceps 5 5   WF C6/7 Med FCR 5 5   WE C7/8 PIN ECU 5 5   F Ab C8/T1 U ADM/FDI 5 5  HF L1/2/3 Fem Illopsoas 5 5   KE L2/3/4 Fem Quad 5 5   DF L4/5 D Peron Tib Ant 5 5   PF S1/2 Tibial Grc/Sol 5 5    Reflexes:  Right Left Comments  Pectoralis      Biceps (C5/6)     Brachioradialis (C5/6)      Triceps (C6/7)      Patellar (L3/4)      Achilles (S1)      Hoffman      Plantar     Jaw jerk    Sensation:  Light touch  intact throughout   Pin prick  intact throughout   Temperature    Vibration   Proprioception    Coordination/Complex Motor:  - Finger to Nose intact bilaterally - Heel to shin intact bilaterally - Rapid alternating movement normal - Gait: Deferred at this time Labs   Lab Results  Component Value Date   NA 137 05/15/2020   K 4.0 05/15/2020   CL 102 05/15/2020   CO2 24 05/14/2020   GLUCOSE 103 (H) 05/15/2020   BUN 38 (H) 05/15/2020   CREATININE 2.50 (H) 05/15/2020   CALCIUM 8.8 (L) 05/14/2020   ALBUMIN 3.5 05/12/2020   AST 40 05/12/2020   ALT 87 (H) 05/12/2020   ALKPHOS 67 05/12/2020   BILITOT 1.5 (H) 05/12/2020   GFRNONAA 30 (L) 05/14/2020   GFRAA 35 (L) 05/14/2020     Imaging and Diagnostic studies  I personally reviewed CT head without contrast which demonstrated diffuse white matter hypoattenuation in bilateral cerebral hemispheres with focal area of hypoattenuation in the right cerebellum.  Findings are suggestive of PRES.  CT angio head and neck: Unfortunately I was not able to personally review this due to issues with the system at this  time.  However, per radiology no large vessel occlusion.  Impression   Sean Charles is a 39 y.o. male with PMH significant for HTN, Cardiomyopathy,  CKD who presents with acute onset aphasia.  NIH stroke scale of 1 with MRS of 0. His neurologic examination is notable for mild aphasia which was particularly notable w when attempting to express complex ideas.  He did not have any other focal deficits.  Given the provided history of a transient right-sided weakness in sleep, I do suspect that this could localize to the left MCA territory and could potentially be a stroke.  However, his symptoms are rapidly improving and he has no right-sided weakness and his aphasia significantly improved on my second assessment.  I discussed with his wife and we decided to hold off on tPA.  He has no LVO, therefore not a candidate for thrombectomy.   He has pretty extensive BL white hypodensities on the CT head and the distribution is more suggestive of PRES. it is important to note that PRES can also mimic Stroke.  He however, denies any typical features of PRES, no headache, no visual disturbance, no ataxia.  Impression: - PRES -Minor ischemic stroke vs TIA.  Recommendations   - Brain imaging- MRI Brain without contrast. - Vascular imaging- CTA Brain/Neck with no LVO per radiology. - TTE - completed on 8/14 with EF of 45-50% - Lipid panel - with LDL of 154 yesterday - Statin - increased to Atorvastatin 80mg  daily. - A1C - ordered  - Antithrombotic - Aspirin 325mg  daily started - SBP goal - aim for SBP < 180 systolic. This is a compromise between his PRES and concern for stroke. - Telemetry monitoring for arrythmia -  Swallow screen - Stroke education. - PT/OT/SLP - Tox screen ordered.  ______________________________________________________________________   Thank you for the opportunity to take part in the care of this patient. If you have any further questions, please contact the neurology consultation  attending.  Signed,  Erick Blinks [facility] Pager Number [phone]

## 2020-05-16 DIAGNOSIS — I6783 Posterior reversible encephalopathy syndrome: Secondary | ICD-10-CM

## 2020-05-16 DIAGNOSIS — I639 Cerebral infarction, unspecified: Secondary | ICD-10-CM

## 2020-05-16 LAB — ANCA TITERS
Atypical P-ANCA titer: <1:20 {titer}
C-ANCA: <1:20 {titer}
P-ANCA: <1:20 {titer}

## 2020-05-16 LAB — BASIC METABOLIC PANEL
Anion gap: 7 (ref 5–15)
BUN: 34 mg/dL — ABNORMAL HIGH (ref 6–20)
CO2: 23 mmol/L (ref 22–32)
Calcium: 8.6 mg/dL — ABNORMAL LOW (ref 8.9–10.3)
Chloride: 106 mmol/L (ref 98–111)
Creatinine, Ser: 2.25 mg/dL — ABNORMAL HIGH (ref 0.61–1.24)
GFR calc Af Amer: 41 mL/min — ABNORMAL LOW (ref >60)
GFR calc non Af Amer: 35 mL/min — ABNORMAL LOW (ref >60)
Glucose, Bld: 91 mg/dL (ref 70–99)
Potassium: 3.7 mmol/L (ref 3.5–5.1)
Sodium: 136 mmol/L (ref 135–145)

## 2020-05-16 LAB — MPO/PR-3 (ANCA) ANTIBODIES
ANCA Proteinase 3: <3.5 U/mL (ref 0.0–3.5)
Myeloperoxidase Abs: <9 U/mL (ref 0.0–9.0)

## 2020-05-16 LAB — LIPID PANEL
Cholesterol: 189 mg/dL (ref 0–200)
HDL: 32 mg/dL — ABNORMAL LOW (ref >40)
LDL Cholesterol: 123 mg/dL — ABNORMAL HIGH (ref 0–99)
Total CHOL/HDL Ratio: 5.9 RATIO
Triglycerides: 168 mg/dL — ABNORMAL HIGH (ref <150)
VLDL: 34 mg/dL (ref 0–40)

## 2020-05-16 LAB — ANTIEXTRACTABLE NUCLEAR AG
ENA SM Ab Ser-aCnc: <0.2 AI (ref 0.0–0.9)
Ribonucleic Protein: 1 AI — ABNORMAL HIGH (ref 0.0–0.9)

## 2020-05-16 LAB — HEMOGLOBIN A1C
Hgb A1c MFr Bld: 4.6 % — ABNORMAL LOW (ref 4.8–5.6)
Mean Plasma Glucose: 85.32 mg/dL

## 2020-05-16 LAB — METANEPHRINES, PLASMA
Metanephrine, Free: 36 pg/mL (ref 0.0–88.0)
Normetanephrine, Free: 163.9 pg/mL — ABNORMAL HIGH (ref 0.0–110.1)

## 2020-05-16 LAB — ANA W/REFLEX IF POSITIVE: Anti Nuclear Antibody (ANA): NEGATIVE

## 2020-05-16 MED ORDER — HYDRALAZINE HCL 10 MG PO TABS
10.0000 mg | ORAL_TABLET | Freq: Three times a day (TID) | ORAL | Status: DC
  Administered 2020-05-16 – 2020-05-17 (×4): 10 mg via ORAL
  Filled 2020-05-16 (×4): qty 1

## 2020-05-16 MED ORDER — HYDRALAZINE HCL 20 MG/ML IJ SOLN
10.0000 mg | Freq: Once | INTRAMUSCULAR | Status: AC
  Administered 2020-05-17: 10 mg via INTRAVENOUS

## 2020-05-16 NOTE — Progress Notes (Signed)
PROGRESS NOTE  Sean Charles [id number]:[id number] DOB: 1981-03-07 DOA: 05/15/2020 PCP: Patient, No Pcp Per   LOS: 1 day   Brief Narrative / Interim history: 39 year old male who was recently diagnosed with hypertension, chronic kidney disease stage III, new onset acute combined CHF, who was admitted to the hospital on 8/14 and discharged on 8/16 came to the hospital again on 8/17 due to aphasia.  Neurology has been consulted on admission.  Subjective / 24h Interval events: He is doing better this morning, no headache, no shortness of breath, no chest pain.  Assessment & Plan: Principal Problem Hypertensive emergency-with neuro symptoms, neurology consulted, discussed with Dr. Amada Jupiter, appreciate input.  He underwent an MRI of the brain on admission which was concerning for PRES however there are significant atypical features.  Non-MS /autoimmune /CNS vasculitis is also a consideration.  MRI showed an unusual pattern of cerebral white matter and infratentorial signal abnormality with tiny acute white matter infarcts.  He underwent an LP which was negative for acute bacterial infection and autoimmune work-up is still pending.  Up until last week patient was unaware that he has elevated blood pressure.  He had metanephrines sent on 8/15 along with aldosterone and renin, still pending -Blood pressure still suboptimal today, will add back hydralazine  Active Problems Chronic kidney disease stage IIIb-patient's creatinine seems to hover in the low mid twos.  Nephrology has been consulted during his prior hospital stay, underwent renal ultrasound, renal artery study, unremarkable.  Thought to be due to longstanding poorly controlled hypertension.  Nephrology will follow up as an outpatient  Chronic combined CHF-a 2D echo done on 8/14 showed an EF 45-50% without WMA.  He has LVH and grade 1 diastolic dysfunction.  He was evaluated by cardiology, also plans outpatient follow-up.   Scheduled Meds: .   stroke: mapping our early stages of recovery book   Does not apply Once  . amLODipine  10 mg Oral Daily  . aspirin  325 mg Oral Daily  . atorvastatin  80 mg Oral Daily  . carvedilol  25 mg Oral BID WC  . enoxaparin (LOVENOX) injection  40 mg Subcutaneous Q24H  . hydrALAZINE  10 mg Oral Q8H  . losartan  25 mg Oral Daily   Continuous Infusions: PRN Meds:.acetaminophen **OR** [DISCONTINUED] acetaminophen (TYLENOL) oral liquid 160 mg/5 mL **OR** [DISCONTINUED] acetaminophen, hydrALAZINE  Diet Orders (From admission, onward)    Start     Ordered   05/15/20 0525  Diet Heart Room service appropriate? Yes; Fluid consistency: Thin  Diet effective now       Question Answer Comment  Room service appropriate? Yes   Fluid consistency: Thin      05/15/20 0524          DVT prophylaxis: enoxaparin (LOVENOX) injection 40 mg Start: 05/15/20 1000     Code Status: Full Code  Family Communication: no family at bedside   Status is: Inpatient  Remains inpatient appropriate because:Inpatient level of care appropriate due to severity of illness   Dispo: The patient is from: Home              Anticipated d/c is to: Home              Anticipated d/c date is: 1 day              Patient currently is not medically stable to d/c.  Consultants:  Neurology   Procedures:  None   Microbiology  None   Antimicrobials: None  Objective: Vitals:   05/15/20 2042 05/15/20 2351 05/16/20 0342 05/16/20 0814  BP:  (!) 142/92 (!) 128/93 (!) 165/106  Pulse:  72 76 70  Resp:  16 16 18   Temp:  98.3 F (36.8 C) 97.8 F (36.6 C) 97.9 F (36.6 C)  TempSrc:  Oral Oral Oral  SpO2:  97% 95% 100%  Weight: 106.5 kg     Height: 5\' 10"  (1.778 m)      No intake or output data in the 24 hours ending 05/16/20 1013 Filed Weights   05/15/20 2042  Weight: 106.5 kg    Examination:  Constitutional: NAD Eyes: no scleral icterus ENMT: Mucous membranes are moist.  Neck: normal, supple Respiratory:  clear to auscultation bilaterally, no wheezing, no crackles.  Cardiovascular: Regular rate and rhythm, no murmurs / rubs / gallops. No LE edema.  Abdomen: non distended, no tenderness. Bowel sounds positive.  Musculoskeletal: no clubbing / cyanosis.  Skin: no rashes Neurologic: CN 2-12 grossly intact. Strength 5/5 in all 4.   Data Reviewed: I have independently reviewed following labs and imaging studies   CBC: Recent Labs  Lab 05/11/20 1534 05/12/20 0345 05/13/20 0933 05/15/20 0132 05/15/20 0138  WBC 8.8 9.4 8.4 9.2  --   NEUTROABS 5.8 6.1 5.3 5.8  --   HGB 15.0 15.2 15.3 14.1 13.9  HCT 42.3 42.0 43.5 41.5 41.0  MCV 91.4 89.7 91.6 93.3  --   PLT 102* 97* 105* 131*  --    Basic Metabolic Panel: Recent Labs  Lab 05/12/20 0345 05/12/20 0345 05/13/20 0933 05/14/20 0323 05/15/20 0132 05/15/20 0138 05/16/20 0143  NA 135   < > 136 138 135 137 136  K 2.8*   < > 3.5 3.3* 3.9 4.0 3.7  CL 101   < > 102 104 103 102 106  CO2 23  --  24 24 24   --  23  GLUCOSE 119*   < > 138* 109* 108* 103* 91  BUN 23*   < > 29* 30* 35* 38* 34*  CREATININE 2.33*   < > 2.59* 2.57* 2.56* 2.50* 2.25*  CALCIUM 8.9  --  9.3 8.8* 9.0  --  8.6*  MG 2.1  --   --   --   --   --   --    < > = values in this interval not displayed.   Liver Function Tests: Recent Labs  Lab 05/12/20 0345 05/15/20 0132  AST 40 25  ALT 87* 52*  ALKPHOS 67 61  BILITOT 1.5* 0.7  PROT 6.4* 6.0*  ALBUMIN 3.5 3.4*   Coagulation Profile: Recent Labs  Lab 05/15/20 0132  INR 1.0   HbA1C: Recent Labs    05/15/20 0132 05/16/20 0143  HGBA1C 4.6* 4.6*   CBG: Recent Labs  Lab 05/15/20 0123  GLUCAP 102*    Recent Results (from the past 240 hour(s))  Novel Coronavirus, NAA ([facility])     Status: None   Collection Time: 05/11/20  1:14 PM   Specimen: Nasopharyngeal Swab; Nasopharyngeal(NP) swabs in vial transport medium   Nasopharynge  Result Value Ref Range Status   SARS-CoV-2, NAA Not Detected Not Detected Final     Comment: This nucleic acid amplification test was developed and its performance characteristics determined by 05/18/20. Nucleic acid amplification tests include RT-PCR and TMA. This test has not been FDA cleared or approved. This test has been authorized by FDA under an Emergency Use Authorization (EUA). This test is only authorized for the  duration of time the declaration that circumstances exist justifying the authorization of the emergency use of in vitro diagnostic tests for detection of SARS-CoV-2 virus and/or diagnosis of COVID-19 infection under section 564(b)(1) of the Act, 21 U.S.C. 016WFU-9(N) (1), unless the authorization is terminated or revoked sooner. When diagnostic testing is negative, the possibility of a false negative result should be considered in the context of a patient's recent exposures and the presence of clinical signs and symptoms consistent with COVID-19. An individual without symptoms of COVID-19 and who is not shedding SARS-CoV-2 virus wo uld expect to have a negative (not detected) result in this assay.   SARS-COV-2, NAA 2 DAY TAT     Status: None   Collection Time: 05/11/20  1:14 PM   Nasopharynge  Result Value Ref Range Status   SARS-CoV-2, NAA 2 DAY TAT Performed  Final  SARS Coronavirus 2 by RT PCR (hospital order, performed in [facility] hospital lab) Nasopharyngeal Nasopharyngeal Swab     Status: None   Collection Time: 05/11/20  8:45 PM   Specimen: Nasopharyngeal Swab  Result Value Ref Range Status   SARS Coronavirus 2 NEGATIVE NEGATIVE Final    Comment: (NOTE) SARS-CoV-2 target nucleic acids are NOT DETECTED.  The SARS-CoV-2 RNA is generally detectable in upper and lower respiratory specimens during the acute phase of infection. The lowest concentration of SARS-CoV-2 viral copies this assay can detect is 250 copies / mL. A negative result does not preclude SARS-CoV-2 infection and should not be used as the sole basis for  treatment or other patient management decisions.  A negative result may occur with improper specimen collection / handling, submission of specimen other than nasopharyngeal swab, presence of viral mutation(s) within the areas targeted by this assay, and inadequate number of viral copies (<250 copies / mL). A negative result must be combined with clinical observations, patient history, and epidemiological information.  Fact Sheet for Patients:   BoilerBrush.com.cy  Fact Sheet for Healthcare Providers: https://pope.com/  This test is not yet approved or  cleared by the [place] FDA and has been authorized for detection and/or diagnosis of SARS-CoV-2 by FDA under an Emergency Use Authorization (EUA).  This EUA will remain in effect (meaning this test can be used) for the duration of the COVID-19 declaration under Section 564(b)(1) of the Act, 21 U.S.C. section 360bbb-3(b)(1), unless the authorization is terminated or revoked sooner.  Performed at [facility], [address] [place], [place] [address]   SARS Coronavirus 2 by RT PCR (hospital order, performed in [facility] hospital lab) Nasopharyngeal Nasopharyngeal Swab     Status: None   Collection Time: 05/15/20  6:49 AM   Specimen: Nasopharyngeal Swab  Result Value Ref Range Status   SARS Coronavirus 2 NEGATIVE NEGATIVE Final    Comment: (NOTE) SARS-CoV-2 target nucleic acids are NOT DETECTED.  The SARS-CoV-2 RNA is generally detectable in upper and lower respiratory specimens during the acute phase of infection. The lowest concentration of SARS-CoV-2 viral copies this assay can detect is 250 copies / mL. A negative result does not preclude SARS-CoV-2 infection and should not be used as the sole basis for treatment or other patient management decisions.  A negative result may occur with improper specimen collection / handling, submission of specimen other than  nasopharyngeal swab, presence of viral mutation(s) within the areas targeted by this assay, and inadequate number of viral copies (<250 copies / mL). A negative result must be combined with clinical observations, patient  history, and epidemiological information.  Fact Sheet for Patients:   BoilerBrush.com.cyhttps://www.fda.gov/media/136312/download  Fact Sheet for Healthcare Providers: https://pope.com/https://www.fda.gov/media/136313/download  This test is not yet approved or  cleared by the [place] FDA and has been authorized for detection and/or diagnosis of SARS-CoV-2 by FDA under an Emergency Use Authorization (EUA).  This EUA will remain in effect (meaning this test can be used) for the duration of the COVID-19 declaration under Section 564(b)(1) of the Act, 21 U.S.C. section 360bbb-3(b)(1), unless the authorization is terminated or revoked sooner.  Performed at [facility], [address] [place], [place] [address]      Radiology Studies: No results found.  Pamella Pertostin Tarryn Bogdan, MD, PhD [facility]  Between 7 am - 7 pm I am available, please contact me via Amion or Securechat  Between 7 pm - 7 am I am not available, please contact night coverage MD/APP via Amion

## 2020-05-16 NOTE — TOC Initial Note (Signed)
Transition of Care [facility]) - Initial/Assessment Note    Patient Details  Name: Sean Charles MRN: [id number] Date of Birth: 23-Mar-1981  Transition of Care [facility]) CM/SW Contact:    Pollie Friar, RN Phone Number: 05/16/2020, 3:22 PM  Clinical Narrative:                 Pt lives home with spouse and denies any medication or transportation issues at home.  Pt without a PcP. CM met with the patient and spouse. They asked for assistance with finding a PCP. Appointment obtained. Information on the AVS.  TOC following for further d/c needs.   Expected Discharge Plan: Home/Self Care Barriers to Discharge: Continued Medical Work up   Patient Goals and CMS Choice        Expected Discharge Plan and Services Expected Discharge Plan: Home/Self Care   Discharge Planning Services: CM Consult   Living arrangements for the past 2 months: Single Family Home                                      Prior Living Arrangements/Services Living arrangements for the past 2 months: Single Family Home Lives with:: Spouse Patient language and need for interpreter reviewed:: Yes Do you feel safe going back to the place where you live?: Yes        Care giver support system in place?: Yes (comment)   Criminal Activity/Legal Involvement Pertinent to Current Situation/Hospitalization: No - Comment as needed  Activities of Daily Living Home Assistive Devices/Equipment: None ADL Screening (condition at time of admission) Patient's cognitive ability adequate to safely complete daily activities?: Yes Is the patient deaf or have difficulty hearing?: No Does the patient have difficulty seeing, even when wearing glasses/contacts?: No Does the patient have difficulty concentrating, remembering, or making decisions?: No Patient able to express need for assistance with ADLs?: Yes Does the patient have difficulty dressing or bathing?: No Independently performs ADLs?: No Does the patient have difficulty  walking or climbing stairs?: No Weakness of Legs: None Weakness of Arms/Hands: None  Permission Sought/Granted                  Emotional Assessment Appearance:: Appears stated age Attitude/Demeanor/Rapport: Engaged Affect (typically observed): Accepting Orientation: : Oriented to Self, Oriented to Place, Oriented to  Time, Oriented to Situation   Psych Involvement: No (comment)  Admission diagnosis:  Aphasia [R47.01] PRES (posterior reversible encephalopathy syndrome) [I67.83] Acute CVA (cerebrovascular accident) [facility]) [I63.9] Patient Active Problem List   Diagnosis Date Noted  . Acute CVA (cerebrovascular accident) ([place]) 05/15/2020  . PRES (posterior reversible encephalopathy syndrome) 05/15/2020  . CHF (congestive heart failure) ([place]) 05/15/2020  . CKD (chronic kidney disease) stage 3, GFR 30-59 ml/min 05/15/2020  . AKI (acute kidney injury) ([place]) 05/12/2020  . Acute on chronic congestive heart failure ([place]) 05/12/2020  . Hypertensive crisis 05/11/2020   PCP:  Patient, No Pcp Per Pharmacy:   [id number] [address], [place] [address] Phone: [phone] Fax: [phone]     Social Determinants of Health (SDOH) Interventions    Readmission Risk Interventions No flowsheet data found.

## 2020-05-16 NOTE — Progress Notes (Signed)
Subjective: Feels improved  Exam: Vitals:   05/16/20 0342 05/16/20 0814  BP: (!) 128/93 (!) 165/106  Pulse: 76 70  Resp: 16 18  Temp: 97.8 F (36.6 C) 97.9 F (36.6 C)  SpO2: 95% 100%   Gen: In bed, NAD Resp: non-labored breathing, no acute distress Abd: soft, nt  Neuro: MS: awake, alert, interactive and appropriate YW:VPXTG, EOMI, VFF Motor: 5/5 thorughout Sensory:intact to LT  Pertinent Labs: A1C 4.6 LDL 123  Impression: 39 year old male with patchy infarcts consistent with concurrent small vessel disease infarcts in the setting of hypertensive emergency.  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 With no elevated protein, pleocytosis, or headaches, I think CNS vasculitis is very unlikely. Indeed, if it was a systemic vasculitis causing renal and CNS effects, I would expect elevated ESR, CRP.  At this point, I think that hypertension is his likley etiolgoy and he will need outpatient follow up.   Recommendations: 1) Blood pressure control 2) ASA $Remo'81mg'wavjE$  daily 3) Agree with high dose statin 4) PT, OT,ST 5) Neurology will be available as needed, please call with further questions or concerns.   Roland Rack, MD [facility] [phone]  If 7pm- 7am, please page neurology on call as listed in [place].

## 2020-05-16 NOTE — Progress Notes (Signed)
PT Cancellation Note/Discharge  Patient Details Name: Sean Charles MRN: [id number] DOB: [birth year]   Cancelled Treatment:    Reason Eval/Treat Not Completed: PT screened, no needs identified, will sign off.  Pt is independent around the room without reports of imbalance, numbness, tingling, weakness in his extremities.  He reports his only symptoms PTA were some speech difficulty and those have resolved.  PT to sign off.   Thanks,  Corinna Capra, PT, DPT  Acute Rehabilitation [phone] pager #[phone] office       Lurena Joiner B Kathryne Ramella 05/16/2020, 3:19 PM

## 2020-05-16 NOTE — Evaluation (Signed)
Occupational Therapy Evaluation Patient Details Name: Sean Charles MRN: [id number] DOB: 02/25/1981 Today's Date: 05/16/2020    History of Present Illness This 39 y.o. male admitted with aphasia due to hypertensive emergency.  MRI concerning for PRES and showed an unusual pattern of cerebral white matter and infratentorial signal abnormality with tiny acute white matter infarcts. PMH Includes:  HTN, combined systolic and diastolic CHF, CKD, cardiomyopathy   Clinical Impression   Patient evaluated by Occupational Therapy with no further acute OT needs identified. All education has been completed and the patient has no further questions. Pt appears to be back to baseline, and is fully independent with ADLs.  No visual or cognitive deficits noted.  See below for any follow-up Occupational Therapy or equipment needs. OT is signing off. Thank you for this referral.      Follow Up Recommendations  No OT follow up    Equipment Recommendations  None recommended by OT    Recommendations for Other Services       Precautions / Restrictions Precautions Precautions: None      Mobility Bed Mobility Overal bed mobility: Independent                Transfers Overall transfer level: Independent                    Balance Overall balance assessment: No apparent balance deficits (not formally assessed)                                         ADL either performed or assessed with clinical judgement   ADL Overall ADL's : Independent                                             Vision Baseline Vision/History: No visual deficits Patient Visual Report: No change from baseline Vision Assessment?: No apparent visual deficits Additional Comments: able to scan environment without difficulty      Perception Perception Perception Tested?: Yes   Praxis Praxis Praxis tested?: Within functional limits    Pertinent Vitals/Pain Pain Assessment:  No/denies pain     Hand Dominance Right   Extremity/Trunk Assessment Upper Extremity Assessment Upper Extremity Assessment: Overall WFL for tasks assessed   Lower Extremity Assessment Lower Extremity Assessment: Overall WFL for tasks assessed   Cervical / Trunk Assessment Cervical / Trunk Assessment: Normal   Communication Communication Communication: No difficulties   Cognition Arousal/Alertness: Awake/alert Behavior During Therapy: WFL for tasks assessed/performed Overall Cognitive Status: Within Functional Limits for tasks assessed                                 General Comments: able to follow 4 step command, and use signage to perform path finding and then find way back to his room.  Short blessed Test administered with pt scoring 0/28  (no errors)   General Comments       Exercises     Shoulder Instructions      Home Living Family/patient expects to be discharged to:: Private residence Living Arrangements: Spouse/significant other;Children  Prior Functioning/Environment Level of Independence: Independent        Comments: works as a Microbiologist.  Fully independent         OT Problem List: Decreased cognition      OT Treatment/Interventions:      OT Goals(Current goals can be found in the care plan section) Acute Rehab OT Goals Patient Stated Goal: did not state  OT Goal Formulation: All assessment and education complete, DC therapy  OT Frequency:     Barriers to D/C:            Co-evaluation              AM-PAC OT "6 Clicks" Daily Activity     Outcome Measure Help from another person eating meals?: None Help from another person taking care of personal grooming?: None Help from another person toileting, which includes using toliet, bedpan, or urinal?: None Help from another person bathing (including washing, rinsing, drying)?: None Help from another person to put  on and taking off regular upper body clothing?: None Help from another person to put on and taking off regular lower body clothing?: None 6 Click Score: 24   End of Session    Activity Tolerance: Patient tolerated treatment well Patient left: in bed;with call bell/phone within reach;with family/visitor present  OT Visit Diagnosis: Cognitive communication deficit (R41.841)                Time: 2202-5427 OT Time Calculation (min): 12 min Charges:  OT General Charges $OT Visit: 1 Visit OT Evaluation $OT Eval Low Complexity: 1 Low  Eber Jones., OTR/L Acute Rehabilitation Services Pager [phone] Office [phone]   Jeani Hawking M 05/16/2020, 4:19 PM

## 2020-05-17 DIAGNOSIS — N1832 Chronic kidney disease, stage 3b: Secondary | ICD-10-CM

## 2020-05-17 DIAGNOSIS — I5042 Chronic combined systolic (congestive) and diastolic (congestive) heart failure: Secondary | ICD-10-CM

## 2020-05-17 DIAGNOSIS — R4701 Aphasia: Secondary | ICD-10-CM

## 2020-05-17 LAB — VDRL, CSF: VDRL Quant, CSF: NONREACTIVE

## 2020-05-17 LAB — ALDOSTERONE + RENIN ACTIVITY W/ RATIO
ALDO / PRA Ratio: 0.9 (ref 0.0–30.0)
Aldosterone: 5.1 ng/dL (ref 0.0–30.0)
PRA LC/MS/MS: 5.677 ng/mL/hr — ABNORMAL HIGH (ref 0.167–5.380)

## 2020-05-17 MED ORDER — HYDRALAZINE HCL 100 MG PO TABS: 50.0000 mg | ORAL_TABLET | Freq: Three times a day (TID) | ORAL | 1 refills | Status: DC

## 2020-05-17 NOTE — Discharge Summary (Signed)
Physician Discharge Summary  Sean Charles ZOX:[id number] DOB: 1981/09/13 DOA: 05/11/2020  PCP: Patient, No Pcp Per  Admit date: 05/11/2020 Discharge date: 05/14/2020  Admitted From: Home.  Disposition: home.   Recommendations for Outpatient Follow-up:  1. Follow up with PCP in 1-2 weeks 2. Please obtain BMP/CBC in one week Please follow up nephrology and cardiology as recommended.   Discharge Condition: stable.  CODE STATUS: FULL CODE Diet recommendation: Heart Healthy   Brief/Interim Summary: 39 year old male without any past medical history who presents to [facility] emergency department with cough and exertional dyspnea. He was found to be in hypertensive crisis and AKI.  His labs revealed elevated BNP, mildly elevated troponin, PND, and orthopnea and sob on exertion.  Discharge Diagnoses:  Principal Problem:   Hypertensive crisis Active Problems:   AKI (acute kidney injury) (HCC)   Acute on chronic congestive heart failure (HCC)   Mild acute combined systolic and diastolic heart failure. Patient currently appears to be compensated at this time.  He was initially diuresed with IV Lasix and Lasix has been discontinued at this time. Cardiology consulted to see if if he needs further intervention. Echocardiogram showed left ventricular ejection fraction of 45 to 50% and grade 1 diastolic dysfunction. Cardiology recommends outpatient follow up    Hypertensive crisis  blood pressure improving slowly, patient symptomatic with aggressive blood pressure control with nausea vomiting.  Continue with Norvasc, Coreg and hydralazine at this time.  No ARB at this time. Secondary hypertension etiology work up orderd.  Arterial duplex of the kidneys unremarkable.  Renin and angiotension ratio ordered and pending.  Recommend outpatient follow up.     Acute kidney injury suspect probably secondary to prolonged uncontrolled hypertension. Appreciate nephrology  consultation and recommendation.  Plan for outpatient follow-up with nephrology on discharge.     Body mass index is 33.85 kg/m. Obesity Recommend outpatient follow-up with PCP regarding weight loss and physical activity.    Patient reports hematuria in the past None this admission.  Continue to monitor.  Urine analysis reviewed.   Discharge Instructions  Discharge Instructions    Diet - low sodium heart healthy   Complete by: As directed    Discharge instructions   Complete by: As directed    Please check your renal parameters checked in 3 days. Please follow up further lab work with nephrology and cardiology as scheduled.   Increase activity slowly   Complete by: As directed      Allergies as of 05/14/2020   No Known Allergies     Medication List    TAKE these medications   amLODipine 10 MG tablet Commonly known as: NORVASC Take 1 tablet (10 mg total) by mouth daily.   atorvastatin 40 MG tablet Commonly known as: LIPITOR Take 1 tablet (40 mg total) by mouth daily.   carvedilol 25 MG tablet Commonly known as: COREG Take 1 tablet (25 mg total) by mouth 2 (two) times daily with a meal.   losartan 25 MG tablet Commonly known as: COZAAR Take 1 tablet (25 mg total) by mouth daily.   melatonin 3 MG Tabs tablet Take 1 tablet (3 mg total) by mouth at bedtime as needed (sleep).   polyethylene glycol 17 g packet Commonly known as: MIRALAX / GLYCOLAX Take 17 g by mouth daily as needed for mild constipation.       Follow-up Information    [facility] Follow up.   Why: Call Monday 8/16 afther 9am to schedule a  follow up appointnment and to establish care Contact information: [address] [place] [address]       Jodelle Red, MD Follow up in 2 week(s).   Specialty: Cardiology Why: The office will call to arrange follow up in the next 2-4 weeks. Contact information: [address] [place] [address] [phone]              No Known Allergies  Consultations:  Nephrology  Cardiology.    Procedures/Studies: DG Chest 2 View  Result Date: 05/11/2020 CLINICAL DATA:  Cough. Nasal congestion. Shortness of breath. Two weeks duration. EXAM: CHEST - 2 VIEW COMPARISON:  None. FINDINGS: Heart size upper limits of normal. Mildly tortuous aorta. No consolidation, collapse or effusion. Possible bronchial thickening suggesting bronchitis. No significant bone finding. IMPRESSION: Possible bronchitis pattern. No consolidation or collapse. Electronically Signed   By: Paulina Fusi M.D.   On: 05/11/2020 13:49   MR BRAIN WO CONTRAST  Result Date: 05/15/2020 CLINICAL DATA:  Transient expressive aphasia EXAM: MRI HEAD WITHOUT CONTRAST TECHNIQUE: Multiplanar, multiecho pulse sequences of the brain and surrounding structures were obtained without intravenous contrast. COMPARISON:  CTA of the head neck from earlier today FINDINGS: Brain: Confluent FLAIR hyperintensity in the cerebral white matter mainly affecting the subcortical regions and centrum semiovale. There is also T2 hyperintensity in the left pons and in the right cerebellum. Few punctate foci of restricted diffusion are seen in the centrum semiovale bilaterally. No cortical infarct or swelling is seen. Few foci of remote microhemorrhage seen in the right cerebral white matter and bilateral cerebellum. No hematoma, hydrocephalus, or focal masslike finding. The cause is uncertain. Given the acute symptomatology and marked hypertension, posterior reversible encephalopathy syndrome is considered but there are multiple atypical features including the infratentorial with asymmetry, the degree of hemispheric confluence, and the lack of cortical involvement. There is a tigroid appearance at the level of the centrum semiovale but no associated clinical features to correlate with the pathologies classically seen with this finding of  perivenular sparing. Non-MS autoimmune/CNS vasculitis is a strong consideration in this patient also with renal failure, LP and autoimmune labs may be contributory. No specific risk factors for CNS infection per the chart, consider HIV labs. Postcontrast imaging would likely not confirm a diagnosis but may still be useful, especially for follow-up purposes. Vascular: Normal flow voids Skull and upper cervical spine: Normal marrow signal Sinuses/Orbits: Generalized sinusitis. IMPRESSION: Unusual pattern of cerebral white matter and infratentorial signal abnormality with tiny acute white matter infarcts, please see differential diagnosis above. Electronically Signed   By: Marnee Spring M.D.   On: 05/15/2020 06:34   US RENAL  Result Date: 05/12/2020 CLINICAL DATA:  Initial evaluation for acute renal failure. EXAM: RENAL / URINARY TRACT ULTRASOUND COMPLETE COMPARISON:  None. FINDINGS: Right Kidney: Renal measurements: 12.3 x 4.2 x 4.4 cm = volume: 119.3 mL. Renal echogenicity within normal limits. No nephrolithiasis or hydronephrosis. No focal renal mass. Left Kidney: Renal measurements: 12.5 x 4.6 x 4.7 cm = volume: 142.0 mL. Renal echogenicity within normal limits. No nephrolithiasis or hydronephrosis. No focal renal mass. Bladder: Appears normal for degree of bladder distention. Other: None. IMPRESSION: Normal renal ultrasound. No hydronephrosis or other acute abnormality. Electronically Signed   By: Rise Mu M.D.   On: 05/12/2020 07:02   ECHOCARDIOGRAM COMPLETE  Result Date: 05/12/2020    ECHOCARDIOGRAM REPORT   Patient Name:   Sean Charles Date of Exam: 05/12/2020 Medical Rec #:  [id number]    Height:       70.0 in Accession #:    [id number]   Weight:       235.9 lb Date of Birth:  Apr 17, 1981    BSA:          2.239 m Patient Age:    38 years     BP:           163/118 mmHg Patient Gender: M            HR:           94 bpm. Exam Location:  Inpatient Procedure: 2D Echo, Cardiac Doppler and Color  Doppler Indications:    CHF-Acute Systolic 428.21 / I50.21  History:        Patient has no prior history of Echocardiogram examinations.                 Risk Factors:Former Smoker.  Sonographer:    Renella Cunas RDCS Referring Phys: [id number] Deno Lunger [facility] IMPRESSIONS  1. Left ventricular ejection fraction, by estimation, is 45 to 50%. The left ventricle has mildly decreased function. The left ventricle has no regional wall motion abnormalities. There is moderate left ventricular hypertrophy. Left ventricular diastolic parameters are consistent with Grade I diastolic dysfunction (impaired relaxation).  2. Right ventricular systolic function is normal. The right ventricular size is normal. Tricuspid regurgitation signal is inadequate for assessing PA pressure.  3. The mitral valve is grossly normal. No evidence of mitral valve regurgitation. No evidence of mitral stenosis.  4. The aortic valve is tricuspid. Aortic valve regurgitation is not visualized. No aortic stenosis is present.  5. Aortic dilatation noted. There is mild dilatation of the aortic root measuring 40 mm.  6. The inferior vena cava is normal in size with greater than 50% respiratory variability, suggesting right atrial pressure of 3 mmHg. FINDINGS  Left Ventricle: Left ventricular ejection fraction, by estimation, is 45 to 50%. The left ventricle has mildly decreased function. The left ventricle has no regional wall motion abnormalities. The left ventricular internal cavity size was normal in size. There is moderate left ventricular hypertrophy. Left ventricular diastolic parameters are consistent with Grade I diastolic dysfunction (impaired relaxation). Right Ventricle: The right ventricular size is normal. No increase in right ventricular wall thickness. Right ventricular systolic function is normal. Tricuspid regurgitation signal is inadequate for assessing PA pressure. Left Atrium: Left atrial size was normal in size. Right Atrium: Right atrial  size was normal in size. Pericardium: A small pericardial effusion is present. The pericardial effusion is circumferential. Mitral Valve: The mitral valve is grossly normal. No evidence of mitral valve regurgitation. No evidence of mitral valve stenosis. Tricuspid Valve: The tricuspid valve is grossly normal. Tricuspid valve regurgitation is not demonstrated. No evidence of tricuspid stenosis. Aortic Valve: The aortic valve is tricuspid. Aortic valve regurgitation is not visualized. No aortic stenosis is present. Pulmonic Valve: The pulmonic valve was grossly normal. Pulmonic valve regurgitation is not visualized. No evidence of pulmonic stenosis. Aorta: Aortic dilatation noted. There is mild dilatation of the aortic root measuring 40 mm. Venous: The inferior vena cava is normal in size with greater than 50% respiratory variability, suggesting right atrial pressure of 3 mmHg. IAS/Shunts: The atrial septum is grossly normal.  LEFT VENTRICLE PLAX 2D LVIDd:         5.30 cm      Diastology LVIDs:         3.90 cm      LV e'  lateral:   6.15 cm/s LV PW:         1.46 cm      LV E/e' lateral: 8.7 LV IVS:        1.33 cm      LV e' medial:    5.40 cm/s LVOT diam:     2.30 cm      LV E/e' medial:  10.0 LV SV:         50 LV SV Index:   22 LVOT Area:     4.15 cm  LV Volumes (MOD) LV vol d, MOD A2C: 153.0 ml LV vol d, MOD A4C: 149.0 ml LV vol s, MOD A2C: 79.7 ml LV vol s, MOD A4C: 79.6 ml LV SV MOD A2C:     73.3 ml LV SV MOD A4C:     149.0 ml LV SV MOD BP:      71.2 ml RIGHT VENTRICLE RV S prime:     8.73 cm/s TAPSE (M-mode): 1.7 cm LEFT ATRIUM             Index       RIGHT ATRIUM           Index LA diam:        4.30 cm 1.92 cm/m  RA Area:     13.10 cm LA Vol (A2C):   72.3 ml 32.29 ml/m RA Volume:   32.70 ml  14.60 ml/m LA Vol (A4C):   59.8 ml 26.71 ml/m LA Biplane Vol: 67.3 ml 30.06 ml/m  AORTIC VALVE LVOT Vmax:   73.60 cm/s LVOT Vmean:  49.800 cm/s LVOT VTI:    0.120 m  AORTA Ao Root diam: 4.00 cm Ao Asc diam:  4.20 cm  MITRAL VALVE MV Area (PHT): 5.50 cm    SHUNTS MV Decel Time: 138 msec    Systemic VTI:  0.12 m MV E velocity: 53.80 cm/s  Systemic Diam: 2.30 cm MV A velocity: 49.40 cm/s MV E/A ratio:  1.09 Lennie Odor MD Electronically signed by Lennie Odor MD Signature Date/Time: 05/12/2020/3:39:36 PM    Final    CT HEAD CODE STROKE WO CONTRAST  Result Date: 05/15/2020 CLINICAL DATA:  Code stroke.  Aphasia EXAM: CT HEAD WITHOUT CONTRAST TECHNIQUE: Contiguous axial images were obtained from the base of the skull through the vertex without intravenous contrast. COMPARISON:  None. FINDINGS: Brain: There is diffuse white matter hypoattenuation throughout both cerebral hemispheres. There is a focal area of hypoattenuation in the right cerebellum. No acute hemorrhage. No hydrocephalus. Vascular: No abnormal hyperdensity of the major intracranial arteries or dural venous sinuses. No intracranial atherosclerosis. Skull: The visualized skull base, calvarium and extracranial soft tissues are normal. Sinuses/Orbits: No fluid levels or advanced mucosal thickening of the visualized paranasal sinuses. No mastoid or middle ear effusion. The orbits are normal. ASPECTS [facility] Stroke Program Early CT Score) - Ganglionic level infarction (caudate, lentiform nuclei, internal capsule, insula, M1-M3 cortex): 7 - Supraganglionic infarction (M4-M6 cortex): 3 Total score (0-10 with 10 being normal): 10 IMPRESSION: 1. Diffuse white matter hypoattenuation throughout both cerebral hemispheres with focal area of hypoattenuation in the right cerebellum. The findings are most suggestive of PRES superimposed on chronic hypertensive white matter changes. A superimposed right cerebellar infarct would be difficult to exclude. 2. No acute hemorrhage. 3. ASPECTS is 10. These results were called by telephone at the time of interpretation on 05/15/2020 at 1:44 am to provider Dr. Marchelle Folks, Who verbally acknowledged these results. Electronically Signed    By: Caryn Bee  Chase Picket M.D.   On: 05/15/2020 01:45   VAS US RENAL ARTERY DUPLEX  Result Date: 05/14/2020 ABDOMINAL VISCERAL Indications: Hypertension Limitations: Air/bowel gas and patient discomfort. Comparison Study: No prior studies. Performing Technologist: Jean Rosenthal  Examination Guidelines: A complete evaluation includes B-mode imaging, spectral Doppler, color Doppler, and power Doppler as needed of all accessible portions of each vessel. Bilateral testing is considered an integral part of a complete examination. Limited examinations for reoccurring indications may be performed as noted.  Duplex Findings: +--------------------+--------+--------+------+--------+  Mesenteric           PSV cm/s EDV cm/s Plaque Comments  +--------------------+--------+--------+------+--------+  Aorta Mid              134                              +--------------------+--------+--------+------+--------+  Celiac Artery Origin   104                              +--------------------+--------+--------+------+--------+  SMA Proximal            96       22                     +--------------------+--------+--------+------+--------+    +------------------+--------+--------+-------+  Right Renal Artery PSV cm/s EDV cm/s Comment  +------------------+--------+--------+-------+  Origin               101       34             +------------------+--------+--------+-------+  Proximal              77       28             +------------------+--------+--------+-------+  Mid                   65       24             +------------------+--------+--------+-------+  Distal                57       22             +------------------+--------+--------+-------+ +-----------------+--------+--------+-------+  Left Renal Artery PSV cm/s EDV cm/s Comment  +-----------------+--------+--------+-------+  Origin               49       17             +-----------------+--------+--------+-------+  Proximal             67       27              +-----------------+--------+--------+-------+  Mid                  64       26             +-----------------+--------+--------+-------+  Distal               63       19             +-----------------+--------+--------+-------+ +------------+--------+--------+----+-----------+--------+--------+----+  Right Kidney PSV cm/s EDV cm/s RI   Left Kidney PSV cm/s EDV cm/s RI    +------------+--------+--------+----+-----------+--------+--------+----+  Upper Pole   23       9  0.61 Upper Pole  22       9        0.60  +------------+--------+--------+----+-----------+--------+--------+----+  Mid          27       12       0.55 Mid         24       11       0.55  +------------+--------+--------+----+-----------+--------+--------+----+  Lower Pole   22       7        0.67 Lower Pole  18       7        0.63  +------------+--------+--------+----+-----------+--------+--------+----+  Hilar        39       14       0.63 Hilar       40       13       0.68  +------------+--------+--------+----+-----------+--------+--------+----+ +------------------+-----+------------------+-----+  Right Kidney             Left Kidney               +------------------+-----+------------------+-----+  RAR                      RAR                       +------------------+-----+------------------+-----+  RAR (manual)             RAR (manual)              +------------------+-----+------------------+-----+  Cortex             0.58  Cortex             0.52   +------------------+-----+------------------+-----+  Cortex thickness         Corex thickness           +------------------+-----+------------------+-----+  Kidney length (cm) 12.70 Kidney length (cm) 12.00  +------------------+-----+------------------+-----+  Summary: Renal:  Right: Normal right Resisitive Index. RRV flow present. Normal size        right kidney. Left:  Normal left Resistive Index. LRV flow present. Normal size of        left kidney.  *See table(s) above for measurements and  observations.  Diagnosing physician: Sherald Hess MD  Electronically signed by Sherald Hess MD on 05/14/2020 at 3:38:14 PM.    Final    CT ANGIO HEAD CODE STROKE  Result Date: 05/15/2020 CLINICAL DATA:  Aphasia and hypertension EXAM: CT ANGIOGRAPHY HEAD AND NECK TECHNIQUE: Multidetector CT imaging of the head and neck was performed using the standard protocol during bolus administration of intravenous contrast. Multiplanar CT image reconstructions and MIPs were obtained to evaluate the vascular anatomy. Carotid stenosis measurements (when applicable) are obtained utilizing NASCET criteria, using the distal internal carotid diameter as the denominator. CONTRAST:  75mL OMNIPAQUE IOHEXOL 350 MG/ML SOLN COMPARISON:  None. FINDINGS: CTA NECK FINDINGS SKELETON: There is no bony spinal canal stenosis. No lytic or blastic lesion. OTHER NECK: Normal pharynx, larynx and major salivary glands. No cervical lymphadenopathy. Unremarkable thyroid gland. UPPER CHEST: No pneumothorax or pleural effusion. No nodules or masses. AORTIC ARCH: There is no calcific atherosclerosis of the aortic arch. There is no aneurysm, dissection or hemodynamically significant stenosis of the visualized portion of the aorta. Conventional 3 vessel aortic branching pattern. The visualized proximal subclavian arteries are widely patent. RIGHT CAROTID SYSTEM: Normal without aneurysm, dissection or stenosis. LEFT  CAROTID SYSTEM: Normal without aneurysm, dissection or stenosis. VERTEBRAL ARTERIES: Left dominant configuration. Both origins are clearly patent. There is no dissection, occlusion or flow-limiting stenosis to the skull base (V1-V3 segments). CTA HEAD FINDINGS POSTERIOR CIRCULATION: --Vertebral arteries: Normal V4 segments. --Inferior cerebellar arteries: Normal. --Basilar artery: Normal. --Superior cerebellar arteries: Normal. --Posterior cerebral arteries (PCA): Normal. ANTERIOR CIRCULATION: --Intracranial internal carotid arteries:  Normal. --Anterior cerebral arteries (ACA): Normal. Both A1 segments are present. Patent anterior communicating artery (a-comm). --Middle cerebral arteries (MCA): Normal. VENOUS SINUSES: As permitted by contrast timing, patent. ANATOMIC VARIANTS: None Review of the MIP images confirms the above findings. IMPRESSION: Normal CTA of the head and neck. Electronically Signed   By: Deatra Robinson M.D.   On: 05/15/2020 01:57   CT ANGIO NECK CODE STROKE  Result Date: 05/15/2020 CLINICAL DATA:  Aphasia and hypertension EXAM: CT ANGIOGRAPHY HEAD AND NECK TECHNIQUE: Multidetector CT imaging of the head and neck was performed using the standard protocol during bolus administration of intravenous contrast. Multiplanar CT image reconstructions and MIPs were obtained to evaluate the vascular anatomy. Carotid stenosis measurements (when applicable) are obtained utilizing NASCET criteria, using the distal internal carotid diameter as the denominator. CONTRAST:  55mL OMNIPAQUE IOHEXOL 350 MG/ML SOLN COMPARISON:  None. FINDINGS: CTA NECK FINDINGS SKELETON: There is no bony spinal canal stenosis. No lytic or blastic lesion. OTHER NECK: Normal pharynx, larynx and major salivary glands. No cervical lymphadenopathy. Unremarkable thyroid gland. UPPER CHEST: No pneumothorax or pleural effusion. No nodules or masses. AORTIC ARCH: There is no calcific atherosclerosis of the aortic arch. There is no aneurysm, dissection or hemodynamically significant stenosis of the visualized portion of the aorta. Conventional 3 vessel aortic branching pattern. The visualized proximal subclavian arteries are widely patent. RIGHT CAROTID SYSTEM: Normal without aneurysm, dissection or stenosis. LEFT CAROTID SYSTEM: Normal without aneurysm, dissection or stenosis. VERTEBRAL ARTERIES: Left dominant configuration. Both origins are clearly patent. There is no dissection, occlusion or flow-limiting stenosis to the skull base (V1-V3 segments). CTA HEAD FINDINGS  POSTERIOR CIRCULATION: --Vertebral arteries: Normal V4 segments. --Inferior cerebellar arteries: Normal. --Basilar artery: Normal. --Superior cerebellar arteries: Normal. --Posterior cerebral arteries (PCA): Normal. ANTERIOR CIRCULATION: --Intracranial internal carotid arteries: Normal. --Anterior cerebral arteries (ACA): Normal. Both A1 segments are present. Patent anterior communicating artery (a-comm). --Middle cerebral arteries (MCA): Normal. VENOUS SINUSES: As permitted by contrast timing, patent. ANATOMIC VARIANTS: None Review of the MIP images confirms the above findings. IMPRESSION: Normal CTA of the head and neck. Electronically Signed   By: Deatra Robinson M.D.   On: 05/15/2020 01:57       Subjective:  No new complaints.  Discharge Exam: Vitals:   05/14/20 0745 05/14/20 1020  BP: (!) 186/120 (!) 141/96  Pulse: 87 78  Resp: 16 16  Temp: 97.7 F (36.5 C) 97.7 F (36.5 C)  SpO2: 96% 95%   Vitals:   05/14/20 0448 05/14/20 0449 05/14/20 0745 05/14/20 1020  BP: (!) 183/104  (!) 186/120 (!) 141/96  Pulse:  87 87 78  Resp: 16  16 16   Temp:  98.6 F (37 C) 97.7 F (36.5 C) 97.7 F (36.5 C)  TempSrc:  Oral Oral Oral  SpO2:  98% 96% 95%  Weight:  109 kg    Height:        General: Pt is alert, awake, not in acute distress Cardiovascular: RRR, S1/S2 +, no rubs, no gallops Respiratory: CTA bilaterally, no wheezing, no rhonchi Abdominal: Soft, NT, ND, bowel sounds + Extremities: no edema, no cyanosis  The results of significant diagnostics from this hospitalization (including imaging, microbiology, ancillary and laboratory) are listed below for reference.     Microbiology: Recent Results (from the past 240 hour(s))  Novel Coronavirus, NAA ([facility])     Status: None   Collection Time: 05/11/20  1:14 PM   Specimen: Nasopharyngeal Swab; Nasopharyngeal(NP) swabs in vial transport medium   Nasopharynge  Result Value Ref Range Status   SARS-CoV-2, NAA Not Detected Not  Detected Final    Comment: This nucleic acid amplification test was developed and its performance characteristics determined by [facility]. Nucleic acid amplification tests include RT-PCR and TMA. This test has not been FDA cleared or approved. This test has been authorized by FDA under an Emergency Use Authorization (EUA). This test is only authorized for the duration of time the declaration that circumstances exist justifying the authorization of the emergency use of in vitro diagnostic tests for detection of SARS-CoV-2 virus and/or diagnosis of COVID-19 infection under section 564(b)(1) of the Act, 21 U.S.C. 782NFA-2([id number]-3(b) (1), unless the authorization is terminated or revoked sooner. When diagnostic testing is negative, the possibility of a false negative result should be considered in the context of a patient's recent exposures and the presence of clinical signs and symptoms consistent with COVID-19. An individual without symptoms of COVID-19 and who is not shedding SARS-CoV-2 virus wo uld expect to have a negative (not detected) result in this assay.   SARS-COV-2, NAA 2 DAY TAT     Status: None   Collection Time: 05/11/20  1:14 PM   Nasopharynge  Result Value Ref Range Status   SARS-CoV-2, NAA 2 DAY TAT Performed  Final  SARS Coronavirus 2 by RT PCR (hospital order, performed in [facility]) Nasopharyngeal Nasopharyngeal Swab     Status: None   Collection Time: 05/11/20  8:45 PM   Specimen: Nasopharyngeal Swab  Result Value Ref Range Status   SARS Coronavirus 2 NEGATIVE NEGATIVE Final    Comment: (NOTE) SARS-CoV-2 target nucleic acids are NOT DETECTED.  The SARS-CoV-2 RNA is generally detectable in upper and lower respiratory specimens during the acute phase of infection. The lowest concentration of SARS-CoV-2 viral copies this assay can detect is 250 copies / mL. A negative result does not preclude SARS-CoV-2 infection and should not be used as the sole  basis for treatment or other patient management decisions.  A negative result may occur with improper specimen collection / handling, submission of specimen other than nasopharyngeal swab, presence of viral mutation(s) within the areas targeted by this assay, and inadequate number of viral copies (<250 copies / mL). A negative result must be combined with clinical observations, patient history, and epidemiological information.  Fact Sheet for Patients:   BoilerBrush.com.cyhttps://www.fda.gov/media/136312/download  Fact Sheet for Healthcare Providers: https://pope.com/https://www.fda.gov/media/136313/download  This test is not yet approved or  cleared by the [place] FDA and has been authorized for detection and/or diagnosis of SARS-CoV-2 by FDA under an Emergency Use Authorization (EUA).  This EUA will remain in effect (meaning this test can be used) for the duration of the COVID-19 declaration under Section 564(b)(1) of the Act, 21 U.S.C. section 360bbb-3(b)(1), unless the authorization is terminated or revoked sooner.  Performed at [facility], [address] [place], [place] [id number]   SARS Coronavirus 2 by RT PCR (hospital order, [facility]) Nasopharyngeal Nasopharyngeal Swab     Status: None   Collection Time: 05/15/20  6:49 AM   Specimen: Nasopharyngeal Swab  Result Value Ref Range Status   SARS Coronavirus 2 NEGATIVE NEGATIVE Final    Comment: (NOTE) SARS-CoV-2 target nucleic acids are NOT DETECTED.  The SARS-CoV-2 RNA is generally detectable in upper and lower respiratory specimens during the acute phase of infection. The lowest concentration of SARS-CoV-2 viral copies this assay can detect is 250 copies / mL. A negative result does not preclude SARS-CoV-2 infection and should not be used as the sole basis for treatment or other patient management decisions.  A negative result may occur with improper specimen collection / handling, submission of specimen  other than nasopharyngeal swab, presence of viral mutation(s) within the areas targeted by this assay, and inadequate number of viral copies (<250 copies / mL). A negative result must be combined with clinical observations, patient history, and epidemiological information.  Fact Sheet for Patients:   BoilerBrush.com.cy  Fact Sheet for Healthcare Providers: https://pope.com/  This test is not yet approved or  cleared by the [place] FDA and has been authorized for detection and/or diagnosis of SARS-CoV-2 by FDA under an Emergency Use Authorization (EUA).  This EUA will remain in effect (meaning this test can be used) for the duration of the COVID-19 declaration under Section 564(b)(1) of the Act, 21 U.S.C. section 360bbb-3(b)(1), unless the authorization is terminated or revoked sooner.  Performed at [facility], [address], [place], [place] [address]      Labs: BNP (last 3 results) Recent Labs    05/11/20 1534  BNP 396.7*   Basic Metabolic Panel: Recent Labs  Lab 05/12/20 0345 05/12/20 0345 05/13/20 0933 05/14/20 0323 05/15/20 0132 05/15/20 0138 05/16/20 0143  NA 135   < > 136 138 135 137 136  K 2.8*   < > 3.5 3.3* 3.9 4.0 3.7  CL 101   < > 102 104 103 102 106  CO2 23  --  24 24 24   --  23  GLUCOSE 119*   < > 138* 109* 108* 103* 91  BUN 23*   < > 29* 30* 35* 38* 34*  CREATININE 2.33*   < > 2.59* 2.57* 2.56* 2.50* 2.25*  CALCIUM 8.9  --  9.3 8.8* 9.0  --  8.6*  MG 2.1  --   --   --   --   --   --    < > = values in this interval not displayed.   Liver Function Tests: Recent Labs  Lab 05/12/20 0345 05/15/20 0132  AST 40 25  ALT 87* 52*  ALKPHOS 67 61  BILITOT 1.5* 0.7  PROT 6.4* 6.0*  ALBUMIN 3.5 3.4*   No results for input(s): LIPASE, AMYLASE in the last 168 hours. No results for input(s): AMMONIA in the last 168 hours. CBC: Recent Labs  Lab 05/11/20 1534 05/12/20 0345 05/13/20 0933  05/15/20 0132 05/15/20 0138  WBC 8.8 9.4 8.4 9.2  --   NEUTROABS 5.8 6.1 5.3 5.8  --   HGB 15.0 15.2 15.3 14.1 13.9  HCT 42.3 42.0 43.5 41.5 41.0  MCV 91.4 89.7 91.6 93.3  --   PLT 102* 97* 105* 131*  --    Cardiac Enzymes: No results for input(s): CKTOTAL, CKMB, CKMBINDEX, TROPONINI in the last 168 hours. BNP: Invalid input(s): POCBNP CBG: Recent Labs  Lab 05/15/20 0123  GLUCAP 102*   D-Dimer No results for input(s): DDIMER in the last 72 hours. Hgb A1c Recent Labs    05/15/20 0132 05/16/20 0143  HGBA1C 4.6* 4.6*   Lipid  Profile Recent Labs    05/16/20 0143  CHOL 189  HDL 32*  LDLCALC 123*  TRIG 168*  CHOLHDL 5.9   Thyroid function studies No results for input(s): TSH, T4TOTAL, T3FREE, THYROIDAB in the last 72 hours.  Invalid input(s): FREET3 Anemia work up No results for input(s): VITAMINB12, FOLATE, FERRITIN, TIBC, IRON, RETICCTPCT in the last 72 hours. Urinalysis    Component Value Date/Time   COLORURINE YELLOW 05/15/2020 0202   APPEARANCEUR CLEAR 05/15/2020 0202   LABSPEC 1.025 05/15/2020 0202   PHURINE 6.0 05/15/2020 0202   GLUCOSEU 50 (A) 05/15/2020 0202   HGBUR NEGATIVE 05/15/2020 0202   BILIRUBINUR NEGATIVE 05/15/2020 0202   KETONESUR NEGATIVE 05/15/2020 0202   PROTEINUR 30 (A) 05/15/2020 0202   NITRITE NEGATIVE 05/15/2020 0202   LEUKOCYTESUR NEGATIVE 05/15/2020 0202   Sepsis Labs Invalid input(s): PROCALCITONIN,  WBC,  LACTICIDVEN Microbiology Recent Results (from the past 240 hour(s))  Novel Coronavirus, NAA ([facility])     Status: None   Collection Time: 05/11/20  1:14 PM   Specimen: Nasopharyngeal Swab; Nasopharyngeal(NP) swabs in vial transport medium   Nasopharynge  Result Value Ref Range Status   SARS-CoV-2, NAA Not Detected Not Detected Final    Comment: This nucleic acid amplification test was developed and its performance characteristics determined by [employer]. Nucleic acid amplification tests include RT-PCR and TMA.  This test has not been FDA cleared or approved. This test has been authorized by FDA under an Emergency Use Authorization (EUA). This test is only authorized for the duration of time the declaration that circumstances exist justifying the authorization of the emergency use of in vitro diagnostic tests for detection of SARS-CoV-2 virus and/or diagnosis of COVID-19 infection under section 564(b)(1) of the Act, 21 U.S.C. 161WRU-0(A) (1), unless the authorization is terminated or revoked sooner. When diagnostic testing is negative, the possibility of a false negative result should be considered in the context of a patient's recent exposures and the presence of clinical signs and symptoms consistent with COVID-19. An individual without symptoms of COVID-19 and who is not shedding SARS-CoV-2 virus wo uld expect to have a negative (not detected) result in this assay.   SARS-COV-2, NAA 2 DAY TAT     Status: None   Collection Time: 05/11/20  1:14 PM   Nasopharynge  Result Value Ref Range Status   SARS-CoV-2, NAA 2 DAY TAT Performed  Final  SARS Coronavirus 2 by RT PCR (hospital order, performed in [facility] hospital lab) Nasopharyngeal Nasopharyngeal Swab     Status: None   Collection Time: 05/11/20  8:45 PM   Specimen: Nasopharyngeal Swab  Result Value Ref Range Status   SARS Coronavirus 2 NEGATIVE NEGATIVE Final    Comment: (NOTE) SARS-CoV-2 target nucleic acids are NOT DETECTED.  The SARS-CoV-2 RNA is generally detectable in upper and lower respiratory specimens during the acute phase of infection. The lowest concentration of SARS-CoV-2 viral copies this assay can detect is 250 copies / mL. A negative result does not preclude SARS-CoV-2 infection and should not be used as the sole basis for treatment or other patient management decisions.  A negative result may occur with improper specimen collection / handling, submission of specimen other than nasopharyngeal swab, presence of viral  mutation(s) within the areas targeted by this assay, and inadequate number of viral copies (<250 copies / mL). A negative result must be combined with clinical observations, patient history, and epidemiological information.  Fact Sheet for Patients:   BoilerBrush.com.cy  Fact Sheet for Healthcare  Providers: https://pope.com/  This test is not yet approved or  cleared by the [place] and has been authorized for detection and/or diagnosis of SARS-CoV-2 by FDA under an Emergency Use Authorization (EUA).  This EUA will remain in effect (meaning this test can be used) for the duration of the COVID-19 declaration under Section 564(b)(1) of the Act, 21 U.S.C. section 360bbb-3(b)(1), unless the authorization is terminated or revoked sooner.  Performed at [facility], [address] [place], [place] [address]   SARS Coronavirus 2 by RT PCR (hospital order, performed in [facility] hospital lab) Nasopharyngeal Nasopharyngeal Swab     Status: None   Collection Time: 05/15/20  6:49 AM   Specimen: Nasopharyngeal Swab  Result Value Ref Range Status   SARS Coronavirus 2 NEGATIVE NEGATIVE Final    Comment: (NOTE) SARS-CoV-2 target nucleic acids are NOT DETECTED.  The SARS-CoV-2 RNA is generally detectable in upper and lower respiratory specimens during the acute phase of infection. The lowest concentration of SARS-CoV-2 viral copies this assay can detect is 250 copies / mL. A negative result does not preclude SARS-CoV-2 infection and should not be used as the sole basis for treatment or other patient management decisions.  A negative result may occur with improper specimen collection / handling, submission of specimen other than nasopharyngeal swab, presence of viral mutation(s) within the areas targeted by this assay, and inadequate number of viral copies (<250 copies / mL). A negative result must be combined with  clinical observations, patient history, and epidemiological information.  Fact Sheet for Patients:   BoilerBrush.com.cy  Fact Sheet for Healthcare Providers: https://pope.com/  This test is not yet approved or  cleared by the [place] FDA and has been authorized for detection and/or diagnosis of SARS-CoV-2 by FDA under an Emergency Use Authorization (EUA).  This EUA will remain in effect (meaning this test can be used) for the duration of the COVID-19 declaration under Section 564(b)(1) of the Act, 21 U.S.C. section 360bbb-3(b)(1), unless the authorization is terminated or revoked sooner.  Performed at [facility], [address] [place], [place] [address]      Time coordinating discharge: 34 minutes  SIGNED:   Kathlen Mody, MD  [facility] 05/17/2020, 11:11 PM

## 2020-05-17 NOTE — Discharge Summary (Signed)
Physician Discharge Summary  Sean Charles QQP:[id number] DOB: 13-Jan-1981 DOA: 05/15/2020  PCP: Patient, No Pcp Per  Admit date: 05/15/2020 Discharge date: 05/17/2020  Admitted From: home Disposition:  home  Recommendations for Outpatient Follow-up:  1. Follow up with PCP in 1-2 weeks  Home Health: none Equipment/Devices: none  Discharge Condition: stable CODE STATUS: Full code Diet recommendation: low sodium  HPI: Per admitting MD, Sean Charles is a 39 y.o. male with medical history significant of chronic combined systolic and diastolic CHF, CKD stage III, hypertension presented to the ED for evaluation of slurred speech and word finding difficulty.  EMS noted elevated blood pressure in the 200s.  Patient was admitted to the hospital for hypertensive crisis and left yesterday.  History provided by patient and wife at bedside.  Wife states they left the hospital yesterday afternoon.  Today 8/17 around 12:30 AM patient woke up and his speech was slurred at that time.  He was having difficulty finding words.  She did not notice any facial droop.  Patient also complained of numbness of his right arm at that time.  No focal weakness reported.  His speech is now back to his baseline and he is no longer experiencing numbness in his right arm.  Patient has no other complaints.  Denies fevers, cough, shortness of breath, chest pain, nausea, vomiting, abdominal pain, or diarrhea.  Hospital Course / Discharge diagnoses: Principal Problem Hypertensive emergency-with neuro symptoms, neurology consulted, discussed with Dr. Amada Jupiter, appreciate input.  He underwent an MRI of the brain on admission which was concerning for PRES however there are significant atypical features.  Non-MS /autoimmune /CNS vasculitis is also a consideration.  MRI showed an unusual pattern of cerebral white matter and infratentorial signal abnormality with tiny acute white matter infarcts.  He underwent an LP which was negative  for acute bacterial infection and autoimmune work-up is still pending at the time of discharge.  Up until last week patient was unaware that he has elevated blood pressure.  He had metanephrines sent on 8/15 along with aldosterone and renin.  Metanephrines were normal, nor metanephrines were slightly elevated however this is uninterpretable in the setting of hypertensive emergency and these studies should be collected outside emergency situations.  His blood pressure trends are generally higher as the day goes on, and I have changed his hydralazine to 50 in the morning, 100 at lunch and 150 in the evening given significantly higher blood pressures in the evening compared to the rest of the day as well as his current episode happening in the evening time.  Active Problems Chronic kidney disease stage IIIb-patient's creatinine seems to hover in the low mid twos.  Nephrology has been consulted during his prior hospital stay, underwent renal ultrasound, renal artery study, unremarkable.  Thought to be due to longstanding poorly controlled hypertension.  Nephrology will follow up as an outpatient  Chronic combined CHF-a 2D echo done on 8/14 showed an EF 45-50% without WMA.  He has LVH and grade 1 diastolic dysfunction.  He was evaluated by cardiology, also plans outpatient follow-up.  Discharge Instructions  Discharge Instructions    Ambulatory referral to Neurology   Complete by: As directed    An appointment is requested in approximately: 2 weeks     Allergies as of 05/17/2020   No Known Allergies     Medication List    TAKE these medications   amLODipine 10 MG tablet Commonly known as: NORVASC Take 1 tablet (10 mg total) by mouth  daily.   atorvastatin 40 MG tablet Commonly known as: LIPITOR Take 1 tablet (40 mg total) by mouth daily.   carvedilol 25 MG tablet Commonly known as: COREG Take 1 tablet (25 mg total) by mouth 2 (two) times daily with a meal.   hydrALAZINE 100 MG  tablet Commonly known as: APRESOLINE Take 0.5-1.5 tablets (50-150 mg total) by mouth every 8 (eight) hours. 50 in the morning, 100 at noon and 150 mg in the evening What changed:   how much to take  additional instructions   losartan 25 MG tablet Commonly known as: COZAAR Take 1 tablet (25 mg total) by mouth daily.   melatonin 3 MG Tabs tablet Take 1 tablet (3 mg total) by mouth at bedtime as needed (sleep).   polyethylene glycol 17 g packet Commonly known as: MIRALAX / GLYCOLAX Take 17 g by mouth daily as needed for mild constipation.       Follow-up Information    [facility] Follow up on 05/24/2020.   Why: Your appointment is at 12:30. Please arrive early and bring a picture ID, insurance card and current meds. Contact information: [address] [place], [place] [address]  [phone]              Consultations:  Neurology   Procedures/Studies:  DG Chest 2 View  Result Date: 05/11/2020 CLINICAL DATA:  Cough. Nasal congestion. Shortness of breath. Two weeks duration. EXAM: CHEST - 2 VIEW COMPARISON:  None. FINDINGS: Heart size upper limits of normal. Mildly tortuous aorta. No consolidation, collapse or effusion. Possible bronchial thickening suggesting bronchitis. No significant bone finding. IMPRESSION: Possible bronchitis pattern. No consolidation or collapse. Electronically Signed   By: Paulina Fusi M.D.   On: 05/11/2020 13:49   MR BRAIN WO CONTRAST  Result Date: 05/15/2020 CLINICAL DATA:  Transient expressive aphasia EXAM: MRI HEAD WITHOUT CONTRAST TECHNIQUE: Multiplanar, multiecho pulse sequences of the brain and surrounding structures were obtained without intravenous contrast. COMPARISON:  CTA of the head neck from earlier today FINDINGS: Brain: Confluent FLAIR hyperintensity in the cerebral white matter mainly affecting the subcortical regions and centrum semiovale. There is also T2 hyperintensity in the left pons and in the  right cerebellum. Few punctate foci of restricted diffusion are seen in the centrum semiovale bilaterally. No cortical infarct or swelling is seen. Few foci of remote microhemorrhage seen in the right cerebral white matter and bilateral cerebellum. No hematoma, hydrocephalus, or focal masslike finding. The cause is uncertain. Given the acute symptomatology and marked hypertension, posterior reversible encephalopathy syndrome is considered but there are multiple atypical features including the infratentorial with asymmetry, the degree of hemispheric confluence, and the lack of cortical involvement. There is a tigroid appearance at the level of the centrum semiovale but no associated clinical features to correlate with the pathologies classically seen with this finding of perivenular sparing. Non-MS autoimmune/CNS vasculitis is a strong consideration in this patient also with renal failure, LP and autoimmune labs may be contributory. No specific risk factors for CNS infection per the chart, consider HIV labs. Postcontrast imaging would likely not confirm a diagnosis but may still be useful, especially for follow-up purposes. Vascular: Normal flow voids Skull and upper cervical spine: Normal marrow signal Sinuses/Orbits: Generalized sinusitis. IMPRESSION: Unusual pattern of cerebral white matter and infratentorial signal abnormality with tiny acute white matter infarcts, please see differential diagnosis above. Electronically Signed   By: Marnee Spring M.D.   On: 05/15/2020 06:34   US RENAL  Result Date: 05/12/2020 CLINICAL DATA:  Initial evaluation for acute renal failure. EXAM: RENAL / URINARY TRACT ULTRASOUND COMPLETE COMPARISON:  None. FINDINGS: Right Kidney: Renal measurements: 12.3 x 4.2 x 4.4 cm = volume: 119.3 mL. Renal echogenicity within normal limits. No nephrolithiasis or hydronephrosis. No focal renal mass. Left Kidney: Renal measurements: 12.5 x 4.6 x 4.7 cm = volume: 142.0 mL. Renal echogenicity  within normal limits. No nephrolithiasis or hydronephrosis. No focal renal mass. Bladder: Appears normal for degree of bladder distention. Other: None. IMPRESSION: Normal renal ultrasound. No hydronephrosis or other acute abnormality. Electronically Signed   By: Rise MuBenjamin  McClintock M.D.   On: 05/12/2020 07:02   ECHOCARDIOGRAM COMPLETE  Result Date: 05/12/2020    ECHOCARDIOGRAM REPORT   Patient Name:   Berton BonMARK Howton Date of Exam: 05/12/2020 Medical Rec #:  [id number]    Height:       70.0 in Accession #:    [id number]   Weight:       235.9 lb Date of Birth:  08-19-1981    BSA:          2.239 m Patient Age:    38 years     BP:           163/118 mmHg Patient Gender: M            HR:           94 bpm. Exam Location:  Inpatient Procedure: 2D Echo, Cardiac Doppler and Color Doppler Indications:    CHF-Acute Systolic 428.21 / I50.21  History:        Patient has no prior history of Echocardiogram examinations.                 Risk Factors:Former Smoker.  Sonographer:    Renella CunasJulia Swaim RDCS Referring Phys: [id number] Deno LungerGEORGE J [facility]S HospitalHALHOUB IMPRESSIONS  1. Left ventricular ejection fraction, by estimation, is 45 to 50%. The left ventricle has mildly decreased function. The left ventricle has no regional wall motion abnormalities. There is moderate left ventricular hypertrophy. Left ventricular diastolic parameters are consistent with Grade I diastolic dysfunction (impaired relaxation).  2. Right ventricular systolic function is normal. The right ventricular size is normal. Tricuspid regurgitation signal is inadequate for assessing PA pressure.  3. The mitral valve is grossly normal. No evidence of mitral valve regurgitation. No evidence of mitral stenosis.  4. The aortic valve is tricuspid. Aortic valve regurgitation is not visualized. No aortic stenosis is present.  5. Aortic dilatation noted. There is mild dilatation of the aortic root measuring 40 mm.  6. The inferior vena cava is normal in size with greater than 50% respiratory  variability, suggesting right atrial pressure of 3 mmHg. FINDINGS  Left Ventricle: Left ventricular ejection fraction, by estimation, is 45 to 50%. The left ventricle has mildly decreased function. The left ventricle has no regional wall motion abnormalities. The left ventricular internal cavity size was normal in size. There is moderate left ventricular hypertrophy. Left ventricular diastolic parameters are consistent with Grade I diastolic dysfunction (impaired relaxation). Right Ventricle: The right ventricular size is normal. No increase in right ventricular wall thickness. Right ventricular systolic function is normal. Tricuspid regurgitation signal is inadequate for assessing PA pressure. Left Atrium: Left atrial size was normal in size. Right Atrium: Right atrial size was normal in size. Pericardium: A small pericardial effusion is present. The pericardial effusion is circumferential. Mitral Valve: The mitral valve is grossly normal. No evidence of mitral valve regurgitation. No evidence of mitral valve stenosis.  Tricuspid Valve: The tricuspid valve is grossly normal. Tricuspid valve regurgitation is not demonstrated. No evidence of tricuspid stenosis. Aortic Valve: The aortic valve is tricuspid. Aortic valve regurgitation is not visualized. No aortic stenosis is present. Pulmonic Valve: The pulmonic valve was grossly normal. Pulmonic valve regurgitation is not visualized. No evidence of pulmonic stenosis. Aorta: Aortic dilatation noted. There is mild dilatation of the aortic root measuring 40 mm. Venous: The inferior vena cava is normal in size with greater than 50% respiratory variability, suggesting right atrial pressure of 3 mmHg. IAS/Shunts: The atrial septum is grossly normal.  LEFT VENTRICLE PLAX 2D LVIDd:         5.30 cm      Diastology LVIDs:         3.90 cm      LV e' lateral:   6.15 cm/s LV PW:         1.46 cm      LV E/e' lateral: 8.7 LV IVS:        1.33 cm      LV e' medial:    5.40 cm/s LVOT  diam:     2.30 cm      LV E/e' medial:  10.0 LV SV:         50 LV SV Index:   22 LVOT Area:     4.15 cm  LV Volumes (MOD) LV vol d, MOD A2C: 153.0 ml LV vol d, MOD A4C: 149.0 ml LV vol s, MOD A2C: 79.7 ml LV vol s, MOD A4C: 79.6 ml LV SV MOD A2C:     73.3 ml LV SV MOD A4C:     149.0 ml LV SV MOD BP:      71.2 ml RIGHT VENTRICLE RV S prime:     8.73 cm/s TAPSE (M-mode): 1.7 cm LEFT ATRIUM             Index       RIGHT ATRIUM           Index LA diam:        4.30 cm 1.92 cm/m  RA Area:     13.10 cm LA Vol (A2C):   72.3 ml 32.29 ml/m RA Volume:   32.70 ml  14.60 ml/m LA Vol (A4C):   59.8 ml 26.71 ml/m LA Biplane Vol: 67.3 ml 30.06 ml/m  AORTIC VALVE LVOT Vmax:   73.60 cm/s LVOT Vmean:  49.800 cm/s LVOT VTI:    0.120 m  AORTA Ao Root diam: 4.00 cm Ao Asc diam:  4.20 cm MITRAL VALVE MV Area (PHT): 5.50 cm    SHUNTS MV Decel Time: 138 msec    Systemic VTI:  0.12 m MV E velocity: 53.80 cm/s  Systemic Diam: 2.30 cm MV A velocity: 49.40 cm/s MV E/A ratio:  1.09 Lennie Odor MD Electronically signed by Lennie Odor MD Signature Date/Time: 05/12/2020/3:39:36 PM    Final    CT HEAD CODE STROKE WO CONTRAST  Result Date: 05/15/2020 CLINICAL DATA:  Code stroke.  Aphasia EXAM: CT HEAD WITHOUT CONTRAST TECHNIQUE: Contiguous axial images were obtained from the base of the skull through the vertex without intravenous contrast. COMPARISON:  None. FINDINGS: Brain: There is diffuse white matter hypoattenuation throughout both cerebral hemispheres. There is a focal area of hypoattenuation in the right cerebellum. No acute hemorrhage. No hydrocephalus. Vascular: No abnormal hyperdensity of the major intracranial arteries or dural venous sinuses. No intracranial atherosclerosis. Skull: The visualized skull base, calvarium and extracranial soft tissues are normal. Sinuses/Orbits:  No fluid levels or advanced mucosal thickening of the visualized paranasal sinuses. No mastoid or middle ear effusion. The orbits are normal. ASPECTS  [facility] Stroke Program Early CT Score) - Ganglionic level infarction (caudate, lentiform nuclei, internal capsule, insula, M1-M3 cortex): 7 - Supraganglionic infarction (M4-M6 cortex): 3 Total score (0-10 with 10 being normal): 10 IMPRESSION: 1. Diffuse white matter hypoattenuation throughout both cerebral hemispheres with focal area of hypoattenuation in the right cerebellum. The findings are most suggestive of PRES superimposed on chronic hypertensive white matter changes. A superimposed right cerebellar infarct would be difficult to exclude. 2. No acute hemorrhage. 3. ASPECTS is 10. These results were called by telephone at the time of interpretation on 05/15/2020 at 1:44 am to provider Dr. Marchelle Folks, Who verbally acknowledged these results. Electronically Signed   By: Deatra Robinson M.D.   On: 05/15/2020 01:45   VAS US RENAL ARTERY DUPLEX  Result Date: 05/14/2020 ABDOMINAL VISCERAL Indications: Hypertension Limitations: Air/bowel gas and patient discomfort. Comparison Study: No prior studies. Performing Technologist: Jean Rosenthal  Examination Guidelines: A complete evaluation includes B-mode imaging, spectral Doppler, color Doppler, and power Doppler as needed of all accessible portions of each vessel. Bilateral testing is considered an integral part of a complete examination. Limited examinations for reoccurring indications may be performed as noted.  Duplex Findings: +--------------------+--------+--------+------+--------+ Mesenteric          PSV cm/sEDV cm/sPlaqueComments +--------------------+--------+--------+------+--------+ Aorta Mid             134                          +--------------------+--------+--------+------+--------+ Celiac Artery Origin  104                          +--------------------+--------+--------+------+--------+ SMA Proximal           96      22                  +--------------------+--------+--------+------+--------+     +------------------+--------+--------+-------+ Right Renal ArteryPSV cm/sEDV cm/sComment +------------------+--------+--------+-------+ Origin              101      34           +------------------+--------+--------+-------+ Proximal             77      28           +------------------+--------+--------+-------+ Mid                  65      24           +------------------+--------+--------+-------+ Distal               57      22           +------------------+--------+--------+-------+ +-----------------+--------+--------+-------+ Left Renal ArteryPSV cm/sEDV cm/sComment +-----------------+--------+--------+-------+ Origin              49      17           +-----------------+--------+--------+-------+ Proximal            67      27           +-----------------+--------+--------+-------+ Mid                 64      26           +-----------------+--------+--------+-------+ Distal  63      19           +-----------------+--------+--------+-------+ +------------+--------+--------+----+-----------+--------+--------+----+ Right KidneyPSV cm/sEDV cm/sRI  Left KidneyPSV cm/sEDV cm/sRI   +------------+--------+--------+----+-----------+--------+--------+----+ Upper Pole  23      9       0.61Upper Pole 22      9       0.60 +------------+--------+--------+----+-----------+--------+--------+----+ Mid         27      12      0.        24      11      0.55 +------------+--------+--------+----+-----------+--------+--------+----+ Lower Pole  22      7       0.67Lower Pole 18      7       0.63 +------------+--------+--------+----+-----------+--------+--------+----+ Hilar       39      14      0.63Hilar      40      13      0.68 +------------+--------+--------+----+-----------+--------+--------+----+ +------------------+-----+------------------+-----+ Right Kidney           Left Kidney              +------------------+-----+------------------+-----+ RAR                    RAR                     +------------------+-----+------------------+-----+ RAR (manual)           RAR (manual)            +------------------+-----+------------------+-----+ Cortex            0.58 Cortex            0.52  +------------------+-----+------------------+-----+ Cortex thickness       Corex thickness         +------------------+-----+------------------+-----+ Kidney length (cm)12.70Kidney length (cm)12.00 +------------------+-----+------------------+-----+  Summary: Renal:  Right: Normal right Resisitive Index. RRV flow present. Normal size        right kidney. Left:  Normal left Resistive Index. LRV flow present. Normal size of        left kidney.  *See table(s) above for measurements and observations.  Diagnosing physician: Sherald Hess MD  Electronically signed by Sherald Hess MD on 05/14/2020 at 3:38:14 PM.    Final    CT ANGIO HEAD CODE STROKE  Result Date: 05/15/2020 CLINICAL DATA:  Aphasia and hypertension EXAM: CT ANGIOGRAPHY HEAD AND NECK TECHNIQUE: Multidetector CT imaging of the head and neck was performed using the standard protocol during bolus administration of intravenous contrast. Multiplanar CT image reconstructions and MIPs were obtained to evaluate the vascular anatomy. Carotid stenosis measurements (when applicable) are obtained utilizing NASCET criteria, using the distal internal carotid diameter as the denominator. CONTRAST:  79mL OMNIPAQUE IOHEXOL 350 MG/ML SOLN COMPARISON:  None. FINDINGS: CTA NECK FINDINGS SKELETON: There is no bony spinal canal stenosis. No lytic or blastic lesion. OTHER NECK: Normal pharynx, larynx and major salivary glands. No cervical lymphadenopathy. Unremarkable thyroid gland. UPPER CHEST: No pneumothorax or pleural effusion. No nodules or masses. AORTIC ARCH: There is no calcific atherosclerosis of the aortic arch. There is no aneurysm,  dissection or hemodynamically significant stenosis of the visualized portion of the aorta. Conventional 3 vessel aortic branching pattern. The visualized proximal subclavian arteries are widely patent. RIGHT CAROTID SYSTEM: Normal without aneurysm, dissection or stenosis. LEFT CAROTID SYSTEM: Normal without aneurysm, dissection or stenosis. VERTEBRAL ARTERIES: Left dominant configuration. Both  origins are clearly patent. There is no dissection, occlusion or flow-limiting stenosis to the skull base (V1-V3 segments). CTA HEAD FINDINGS POSTERIOR CIRCULATION: --Vertebral arteries: Normal V4 segments. --Inferior cerebellar arteries: Normal. --Basilar artery: Normal. --Superior cerebellar arteries: Normal. --Posterior cerebral arteries (PCA): Normal. ANTERIOR CIRCULATION: --Intracranial internal carotid arteries: Normal. --Anterior cerebral arteries (ACA): Normal. Both A1 segments are present. Patent anterior communicating artery (a-comm). --Middle cerebral arteries (MCA): Normal. VENOUS SINUSES: As permitted by contrast timing, patent. ANATOMIC VARIANTS: None Review of the MIP images confirms the above findings. IMPRESSION: Normal CTA of the head and neck. Electronically Signed   By: Deatra Robinson M.D.   On: 05/15/2020 01:57   CT ANGIO NECK CODE STROKE  Result Date: 05/15/2020 CLINICAL DATA:  Aphasia and hypertension EXAM: CT ANGIOGRAPHY HEAD AND NECK TECHNIQUE: Multidetector CT imaging of the head and neck was performed using the standard protocol during bolus administration of intravenous contrast. Multiplanar CT image reconstructions and MIPs were obtained to evaluate the vascular anatomy. Carotid stenosis measurements (when applicable) are obtained utilizing NASCET criteria, using the distal internal carotid diameter as the denominator. CONTRAST:  75mL OMNIPAQUE IOHEXOL 350 MG/ML SOLN COMPARISON:  None. FINDINGS: CTA NECK FINDINGS SKELETON: There is no bony spinal canal stenosis. No lytic or blastic lesion. OTHER  NECK: Normal pharynx, larynx and major salivary glands. No cervical lymphadenopathy. Unremarkable thyroid gland. UPPER CHEST: No pneumothorax or pleural effusion. No nodules or masses. AORTIC ARCH: There is no calcific atherosclerosis of the aortic arch. There is no aneurysm, dissection or hemodynamically significant stenosis of the visualized portion of the aorta. Conventional 3 vessel aortic branching pattern. The visualized proximal subclavian arteries are widely patent. RIGHT CAROTID SYSTEM: Normal without aneurysm, dissection or stenosis. LEFT CAROTID SYSTEM: Normal without aneurysm, dissection or stenosis. VERTEBRAL ARTERIES: Left dominant configuration. Both origins are clearly patent. There is no dissection, occlusion or flow-limiting stenosis to the skull base (V1-V3 segments). CTA HEAD FINDINGS POSTERIOR CIRCULATION: --Vertebral arteries: Normal V4 segments. --Inferior cerebellar arteries: Normal. --Basilar artery: Normal. --Superior cerebellar arteries: Normal. --Posterior cerebral arteries (PCA): Normal. ANTERIOR CIRCULATION: --Intracranial internal carotid arteries: Normal. --Anterior cerebral arteries (ACA): Normal. Both A1 segments are present. Patent anterior communicating artery (a-comm). --Middle cerebral arteries (MCA): Normal. VENOUS SINUSES: As permitted by contrast timing, patent. ANATOMIC VARIANTS: None Review of the MIP images confirms the above findings. IMPRESSION: Normal CTA of the head and neck. Electronically Signed   By: Deatra Robinson M.D.   On: 05/15/2020 01:57      Subjective: - no chest pain, shortness of breath, no abdominal pain, nausea or vomiting.   Discharge Exam: BP (!) 142/99 (BP Location: Left Arm)   Pulse 70   Temp 98.1 F (36.7 C) (Oral)   Resp 18   Ht 5\' 10"  (1.778 m)   Wt 106.5 kg   SpO2 100%   BMI 33.69 kg/m   General: Pt is alert, awake, not in acute distress Cardiovascular: RRR, S1/S2 +, no rubs, no gallops Respiratory: CTA bilaterally, no  wheezing, no rhonchi Abdominal: Soft, NT, ND, bowel sounds + Extremities: no edema, no cyanosis    The results of significant diagnostics from this hospitalization (including imaging, microbiology, ancillary and laboratory) are listed below for reference.     Microbiology: Recent Results (from the past 240 hour(s))  Novel Coronavirus, NAA ([facility])     Status: None   Collection Time: 05/11/20  1:14 PM   Specimen: Nasopharyngeal Swab; Nasopharyngeal(NP) swabs in vial transport medium   Nasopharynge  Result Value Ref Range  Status   SARS-CoV-2, NAA Not Detected Not Detected Final    Comment: This nucleic acid amplification test was developed and its performance characteristics determined by [employer]. Nucleic acid amplification tests include RT-PCR and TMA. This test has not been FDA cleared or approved. This test has been authorized by FDA under an Emergency Use Authorization (EUA). This test is only authorized for the duration of time the declaration that circumstances exist justifying the authorization of the emergency use of in vitro diagnostic tests for detection of SARS-CoV-2 virus and/or diagnosis of COVID-19 infection under section 564(b)(1) of the Act, 21 U.S.C. 989QJJ-9(E) (1), unless the authorization is terminated or revoked sooner. When diagnostic testing is negative, the possibility of a false negative result should be considered in the context of a patient's recent exposures and the presence of clinical signs and symptoms consistent with COVID-19. An individual without symptoms of COVID-19 and who is not shedding SARS-CoV-2 virus wo uld expect to have a negative (not detected) result in this assay.   SARS-COV-2, NAA 2 DAY TAT     Status: None   Collection Time: 05/11/20  1:14 PM   Nasopharynge  Result Value Ref Range Status   SARS-CoV-2, NAA 2 DAY TAT Performed  Final  SARS Coronavirus 2 by RT PCR (hospital order, performed in [facility] hospital lab)  Nasopharyngeal Nasopharyngeal Swab     Status: None   Collection Time: 05/11/20  8:45 PM   Specimen: Nasopharyngeal Swab  Result Value Ref Range Status   SARS Coronavirus 2 NEGATIVE NEGATIVE Final    Comment: (NOTE) SARS-CoV-2 target nucleic acids are NOT DETECTED.  The SARS-CoV-2 RNA is generally detectable in upper and lower respiratory specimens during the acute phase of infection. The lowest concentration of SARS-CoV-2 viral copies this assay can detect is 250 copies / mL. A negative result does not preclude SARS-CoV-2 infection and should not be used as the sole basis for treatment or other patient management decisions.  A negative result may occur with improper specimen collection / handling, submission of specimen other than nasopharyngeal swab, presence of viral mutation(s) within the areas targeted by this assay, and inadequate number of viral copies (<250 copies / mL). A negative result must be combined with clinical observations, patient history, and epidemiological information.  Fact Sheet for Patients:   BoilerBrush.com.cy  Fact Sheet for Healthcare Providers: https://pope.com/  This test is not yet approved or  cleared by the [place] FDA and has been authorized for detection and/or diagnosis of SARS-CoV-2 by FDA under an Emergency Use Authorization (EUA).  This EUA will remain in effect (meaning this test can be used) for the duration of the COVID-19 declaration under Section 564(b)(1) of the Act, 21 U.S.C. section 360bbb-3(b)(1), unless the authorization is terminated or revoked sooner.  Performed at [facility], [address] [place], [place] [address]   SARS Coronavirus 2 by RT PCR (hospital order, performed in [facility] hospital lab) Nasopharyngeal Nasopharyngeal Swab     Status: None   Collection Time: 05/15/20  6:49 AM   Specimen: Nasopharyngeal Swab  Result Value Ref Range Status   SARS  Coronavirus 2 NEGATIVE NEGATIVE Final    Comment: (NOTE) SARS-CoV-2 target nucleic acids are NOT DETECTED.  The SARS-CoV-2 RNA is generally detectable in upper and lower respiratory specimens during the acute phase of infection. The lowest concentration of SARS-CoV-2 viral copies this assay can detect is 250 copies / mL. A negative result does not preclude SARS-CoV-2 infection  and should not be used as the sole basis for treatment or other patient management decisions.  A negative result may occur with improper specimen collection / handling, submission of specimen other than nasopharyngeal swab, presence of viral mutation(s) within the areas targeted by this assay, and inadequate number of viral copies (<250 copies / mL). A negative result must be combined with clinical observations, patient history, and epidemiological information.  Fact Sheet for Patients:   BoilerBrush.com.cy  Fact Sheet for Healthcare Providers: https://pope.com/  This test is not yet approved or  cleared by the [place] FDA and has been authorized for detection and/or diagnosis of SARS-CoV-2 by FDA under an Emergency Use Authorization (EUA).  This EUA will remain in effect (meaning this test can be used) for the duration of the COVID-19 declaration under Section 564(b)(1) of the Act, 21 U.S.C. section 360bbb-3(b)(1), unless the authorization is terminated or revoked sooner.  Performed at [facility], [address] [place], [place] [address]      Labs: Basic Metabolic Panel: Recent Labs  Lab 05/12/20 0345 05/12/20 0345 05/13/20 0933 05/14/20 0323 05/15/20 0132 05/15/20 0138 05/16/20 0143  NA 135   < > 136 138 135 137 136  K 2.8*   < > 3.5 3.3* 3.9 4.0 3.7  CL 101   < > 102 104 103 102 106  CO2 23  --  24 24 24   --  23  GLUCOSE 119*   < > 138* 109* 108* 103* 91  BUN 23*   < > 29* 30* 35* 38* 34*  CREATININE 2.33*   < > 2.59* 2.57*  2.56* 2.50* 2.25*  CALCIUM 8.9  --  9.3 8.8* 9.0  --  8.6*  MG 2.1  --   --   --   --   --   --    < > = values in this interval not displayed.   Liver Function Tests: Recent Labs  Lab 05/12/20 0345 05/15/20 0132  AST 40 25  ALT 87* 52*  ALKPHOS 67 61  BILITOT 1.5* 0.7  PROT 6.4* 6.0*  ALBUMIN 3.5 3.4*   CBC: Recent Labs  Lab 05/11/20 1534 05/12/20 0345 05/13/20 0933 05/15/20 0132 05/15/20 0138  WBC 8.8 9.4 8.4 9.2  --   NEUTROABS 5.8 6.1 5.3 5.8  --   HGB 15.0 15.2 15.3 14.1 13.9  HCT 42.3 42.0 43.5 41.5 41.0  MCV 91.4 89.7 91.6 93.3  --   PLT 102* 97* 105* 131*  --    CBG: Recent Labs  Lab 05/15/20 0123  GLUCAP 102*   Hgb A1c Recent Labs    05/15/20 0132 05/16/20 0143  HGBA1C 4.6* 4.6*   Lipid Profile Recent Labs    05/16/20 0143  CHOL 189  HDL 32*  LDLCALC 123*  TRIG 168*  CHOLHDL 5.9   Thyroid function studies No results for input(s): TSH, T4TOTAL, T3FREE, THYROIDAB in the last 72 hours.  Invalid input(s): FREET3 Urinalysis    Component Value Date/Time   COLORURINE YELLOW 05/15/2020 0202   APPEARANCEUR CLEAR 05/15/2020 0202   LABSPEC 1.025 05/15/2020 0202   PHURINE 6.0 05/15/2020 0202   GLUCOSEU 50 (A) 05/15/2020 0202   HGBUR NEGATIVE 05/15/2020 0202   BILIRUBINUR NEGATIVE 05/15/2020 0202   KETONESUR NEGATIVE 05/15/2020 0202   PROTEINUR 30 (A) 05/15/2020 0202   NITRITE NEGATIVE 05/15/2020 0202   LEUKOCYTESUR NEGATIVE 05/15/2020 0202    FURTHER DISCHARGE INSTRUCTIONS:   Get Medicines reviewed and adjusted: Please take all  your medications with you for your next visit with your Primary MD   Laboratory/radiological data: Please request your Primary MD to go over all hospital tests and procedure/radiological results at the follow up, please ask your Primary MD to get all Hospital records sent to his/her office.   In some cases, they will be blood work, cultures and biopsy results pending at the time of your discharge. Please request  that your primary care M.D. goes through all the records of your hospital data and follows up on these results.   Also Note the following: If you experience worsening of your admission symptoms, develop shortness of breath, life threatening emergency, suicidal or homicidal thoughts you must seek medical attention immediately by calling 911 or calling your MD immediately  if symptoms less severe.   You must read complete instructions/literature along with all the possible adverse reactions/side effects for all the Medicines you take and that have been prescribed to you. Take any new Medicines after you have completely understood and accpet all the possible adverse reactions/side effects.    Do not drive when taking Pain medications or sleeping medications (Benzodaizepines)   Do not take more than prescribed Pain, Sleep and Anxiety Medications. It is not advisable to combine anxiety,sleep and pain medications without talking with your primary care practitioner   Special Instructions: If you have smoked or chewed Tobacco  in the last 2 yrs please stop smoking, stop any regular Alcohol  and or any Recreational drug use.   Wear Seat belts while driving.   Please note: You were cared for by a hospitalist during your hospital stay. Once you are discharged, your primary care physician will handle any further medical issues. Please note that NO REFILLS for any discharge medications will be authorized once you are discharged, as it is imperative that you return to your primary care physician (or establish a relationship with a primary care physician if you do not have one) for your post hospital discharge needs so that they can reassess your need for medications and monitor your lab values.  Time coordinating discharge: 40 minutes  SIGNED:  Pamella Pert, MD, PhD 05/17/2020, 7:32 AM

## 2020-05-17 NOTE — Discharge Instructions (Signed)
Follow with Sean Charles, No Pcp Per in 5-7 days  Please get a complete blood count and chemistry panel checked by your Primary MD at your next visit, and again as instructed by your Primary MD. Please get your medications reviewed and adjusted by your Primary MD.  Please request your Primary MD to go over all Hospital Tests and Procedure/Radiological results at the follow up, please get all Hospital records sent to your Prim MD by signing hospital release before you go home.  In some cases, there will be blood work, cultures and biopsy results pending at the time of your discharge. Please request that your primary care M.D. goes through all the records of your hospital data and follows up on these results.  If you had Pneumonia of Lung problems at the Hospital: Please get a 2 view Chest X ray done in 6-8 weeks after hospital discharge or sooner if instructed by your Primary MD.  If you have Congestive Heart Failure: Please call your Cardiologist or Primary MD anytime you have any of the following symptoms:  1) 3 pound weight gain in 24 hours or 5 pounds in 1 week  2) shortness of breath, with or without a dry hacking cough  3) swelling in the hands, feet or stomach  4) if you have to sleep on extra pillows at night in order to breathe  Follow cardiac low salt diet and 1.5 lit/day fluid restriction.  If you have diabetes Accuchecks 4 times/day, Once in AM empty stomach and then before each meal. Log in all results and show them to your primary doctor at your next visit. If any glucose reading is under 80 or above 300 call your primary MD immediately.  If you have Seizure/Convulsions/Epilepsy: Please do not drive, operate heavy machinery, participate in activities at heights or participate in high speed sports until you have seen by Primary MD or a Neurologist and advised to do so again. Per [facility] statutes, patients with seizures are not allowed to drive until they have been  seizure-free for six months.  Use caution when using heavy equipment or power tools. Avoid working on ladders or at heights. Take showers instead of baths. Ensure the water temperature is not too high on the home water heater. Do not go swimming alone. Do not lock yourself in a room alone (i.e. bathroom). When caring for infants or small children, sit down when holding, feeding, or changing them to minimize risk of injury to the child in the event you have a seizure. Maintain good sleep hygiene. Avoid alcohol.   If you had Gastrointestinal Bleeding: Please ask your Primary MD to check a complete blood count within one week of discharge or at your next visit. Your endoscopic/colonoscopic biopsies that are pending at the time of discharge, will also need to followed by your Primary MD.  Get Medicines reviewed and adjusted. Please take all your medications with you for your next visit with your Primary MD  Please request your Primary MD to go over all hospital tests and procedure/radiological results at the follow up, please ask your Primary MD to get all Hospital records sent to his/her office.  If you experience worsening of your admission symptoms, develop shortness of breath, life threatening emergency, suicidal or homicidal thoughts you must seek medical attention immediately by calling 911 or calling your MD immediately  if symptoms less severe.  You must read complete instructions/literature along with all the possible adverse reactions/side effects for all the Medicines you  take and that have been prescribed to you. Take any new Medicines after you have completely understood and accpet all the possible adverse reactions/side effects.   Do not drive or operate heavy machinery when taking Pain medications.   Do not take more than prescribed Pain, Sleep and Anxiety Medications  Special Instructions: If you have smoked or chewed Tobacco  in the last 2 yrs please stop smoking, stop any regular  Alcohol  and or any Recreational drug use.  Wear Seat belts while driving.  Please note You were cared for by a hospitalist during your hospital stay. If you have any questions about your discharge medications or the care you received while you were in the hospital after you are discharged, you can call the unit and asked to speak with the hospitalist on call if the hospitalist that took care of you is not available. Once you are discharged, your primary care physician will handle any further medical issues. Please note that NO REFILLS for any discharge medications will be authorized once you are discharged, as it is imperative that you return to your primary care physician (or establish a relationship with a primary care physician if you do not have one) for your aftercare needs so that they can reassess your need for medications and monitor your lab values.  You can reach the hospitalist office at phone [phone] or fax [phone]   If you do not have a primary care physician, you can call [phone] for a physician referral.  Activity: As tolerated with Full fall precautions use walker/cane & assistance as needed    Diet: low sodium  Disposition Home

## 2020-05-17 NOTE — Evaluation (Signed)
SLP Cancellation Note  Patient Details Name: Sean Charles MRN: [id number] DOB: June 07, 1981   Cancelled treatment:       Reason Eval/Treat Not Completed: SLP screened, no needs identified, will sign off   [name] 05/17/2020, 10:51 AM   Sean Infante, MS [facility] SLP Acute Rehab Services Office [phone]

## 2020-05-21 LAB — CRYOGLOBULIN

## 2020-06-04 ENCOUNTER — Telehealth: Payer: Self-pay | Admitting: Neurology

## 2020-06-04 NOTE — Telephone Encounter (Signed)
Unfortunately this patient will need to be rescheduled due to physician schedule Wednesday morning. This is a very young patient with strokes I would like him to be rescheduled with our stroke expert Dr. Pearlean Brownie as this would be best for his care,  please. Dr. Pearlean Brownie will you please see this patient and do you think you could put the in an appointment within the next few weeks please? Bethany, please see Dr. Pearlean Brownie.

## 2020-06-05 NOTE — Telephone Encounter (Signed)
I spoke with Dr Pearlean Brownie and he is ok with the change. He just happened to have 9:30 AM tomorrow 9/8 open so I changed the pt's appt to Dr Pearlean Brownie. No change in time or day.

## 2020-06-06 ENCOUNTER — Other Ambulatory Visit: Payer: Self-pay

## 2020-06-06 ENCOUNTER — Encounter: Payer: Self-pay | Admitting: Neurology

## 2020-06-06 ENCOUNTER — Ambulatory Visit (INDEPENDENT_AMBULATORY_CARE_PROVIDER_SITE_OTHER): Payer: BC Managed Care – PPO | Admitting: Neurology

## 2020-06-06 VITALS — BP 109/67 | HR 67 | Ht 69.0 in | Wt 228.0 lb

## 2020-06-06 DIAGNOSIS — I10 Essential (primary) hypertension: Secondary | ICD-10-CM

## 2020-06-06 DIAGNOSIS — R202 Paresthesia of skin: Secondary | ICD-10-CM

## 2020-06-06 DIAGNOSIS — I161 Hypertensive emergency: Secondary | ICD-10-CM

## 2020-06-06 DIAGNOSIS — I6381 Other cerebral infarction due to occlusion or stenosis of small artery: Secondary | ICD-10-CM | POA: Diagnosis not present

## 2020-06-06 DIAGNOSIS — R200 Anesthesia of skin: Secondary | ICD-10-CM

## 2020-06-06 MED ORDER — ASPIRIN EC 81 MG PO TBEC: 81.0000 mg | DELAYED_RELEASE_TABLET | Freq: Every day | ORAL | 11 refills | Status: AC

## 2020-06-06 NOTE — Progress Notes (Signed)
[facility] [address] [place]. [place] [address] [phone]       OFFICE CONSULT NOTE  Mr. Sean Charles Date of Birth:  1981-05-10 Medical Record Number:  [id number]   Referring MD: Kathrynn Speed  Reason for Referral: Stroke HPI: Sean Charles is a 39 year old Caucasian male seen today for initial office consultation visit for stroke.  History is obtained from the patient, review of electronic medical records and I personally reviewed available imaging films in PACS.  Patient states he was not aware that he had history of hypertension until 3 weeks ago on 05/11/2020.  He was seen at [facility] emergency room with significantly elevated blood pressure of 223/148.  EKG showed evidence of left ventricular hypertrophy with QTC prolongation it was treated as hypertensive emergency and he feels a blood pressure was brought down too quickly.  He was he was admitted for a few days and blood pressure was brought down and discharge diagnoses include hypertensive crisis and acute kidney injury and acute on chronic congestive heart failure.  Echocardiogram showed left ventricular ejection fraction of 45 to 50% with grade 1 diastolic dysfunction.  Labs showed elevated BNP and mildly elevated troponin and he complained of paroxysmal nocturnal dyspnea and orthopnea and shortness of breath on exertion.  Cardiology was consulted and he was discharged home on 05/14/2016.  He felt his blood pressure was low too much and he presented back to the hospital the next day as a code stroke with sudden onset of speech difficulties.  CT scan of the head showed no acute abnormalities extensive white matter changes.  CT angiogram of the brain and neck showed no significant large vessel stenosis or occlusion.  MRI scan of the brain showed slightly unusual pattern of extensive confluent white matter hyperintensities on T2/FLAIR involving bilateral periventricular and subcortical regions including the  brainstem.  There were also tiny punctate weakly diffusion positive lesions in bilateral deep white matter but it was unclear to me whether this represents acute strokes or just T2 shine through.  Patient underwent a spinal tap to look for vasculitis but the spinal fluid was completely normal with normal protein glucose and absence of cells.  Urine metanephrines were slightly elevated but this was of unclear significance in setting of hypertensive crisis.  Renal ultrasound showed no evidence of renal artery stenosis.  LDL cholesterol elevated 126 mg percent and hemoglobin A1c was 4.6.  ANA, antineutrophilic antibodies were negative.  ESR was 9 mm C-reactive protein was normal.  Patient states is done well since discharge she is started eating healthy he is cut out salt and red meat.  He has lost about 20 pounds.  Is eating a healthy diet.  He has been monitoring his blood pressure very well.  In fact today it is quite low at 109/6 7 in office.  He is on 5 different blood pressure medications.  He does have upcoming appointments with nephrologist as well as primary care physician to discuss his blood pressure medications.  Patient states that he did have some transient speech difficulties prompting his second admission but this resolved by the time he reached the ER.  He has had no further recurrent stroke or TIA symptoms.  He denies any prior history of rash, tick bite, high risk sexual behavior, drug abuse.  There is no family history of strokes or heart attacks in a young age.    ROS:   14 system review of systems is positive for numbness, tingling, weakness, gait difficulty  all other systems negative  PMH:  Past Medical History:  Diagnosis Date  . Cardiomyopathy ([place])   . Chronic kidney disease    elevated SCr during admit for HTN crisis (first recognized) 04/2020  . Combined systolic & diastolic CHF   . HTN (hypertension) 04/2020    Social History:  Social History   Socioeconomic History  .  Marital status: Married    Spouse name: Not on file  . Number of children: 1  . Years of education: Not on file  . Highest education level: Not on file  Occupational History  . Occupation: Geophysical data processor  Tobacco Use  . Smoking status: Former Smoker    Quit date: 2016    Years since quitting: 5.6  . Smokeless tobacco: Never Used  Vaping Use  . Vaping Use: Never used  Substance and Sexual Activity  . Alcohol use: Yes    Alcohol/week: 7.0 standard drinks    Types: 7 Cans of beer per week    Comment: daily  . Drug use: Not Currently    Types: Cocaine    Comment: No use in many years  . Sexual activity: Not on file  Other Topics Concern  . Not on file  Social History Narrative   Former Armed forces training and education officer   Social Determinants of Health   Financial Resource Strain:   . Difficulty of Paying Living Expenses: Not on file  Food Insecurity:   . Worried About Charity fundraiser in the Last Year: Not on file  . Ran Out of Food in the Last Year: Not on file  Transportation Needs:   . Lack of Transportation (Medical): Not on file  . Lack of Transportation (Non-Medical): Not on file  Physical Activity:   . Days of Exercise per Week: Not on file  . Minutes of Exercise per Session: Not on file  Stress:   . Feeling of Stress : Not on file  Social Connections:   . Frequency of Communication with Friends and Family: Not on file  . Frequency of Social Gatherings with Friends and Family: Not on file  . Attends Religious Services: Not on file  . Active Member of Clubs or Organizations: Not on file  . Attends Archivist Meetings: Not on file  . Marital Status: Not on file  Intimate Partner Violence:   . Fear of Current or Ex-Partner: Not on file  . Emotionally Abused: Not on file  . Physically Abused: Not on file  . Sexually Abused: Not on file    Medications:   Current Outpatient Medications on File Prior to Visit  Medication Sig Dispense Refill  . amLODipine (NORVASC) 10 MG  tablet Take 1 tablet (10 mg total) by mouth daily. 30 tablet 1  . atorvastatin (LIPITOR) 40 MG tablet Take 1 tablet (40 mg total) by mouth daily. 30 tablet 1  . carvedilol (COREG) 25 MG tablet Take 1 tablet (25 mg total) by mouth 2 (two) times daily with a meal. 60 tablet 1  . chlorthalidone (HYGROTON) 25 MG tablet Take 12.5 mg by mouth daily.    . hydrALAZINE (APRESOLINE) 100 MG tablet Take 0.5-1.5 tablets (50-150 mg total) by mouth every 8 (eight) hours. 50 in the morning, 100 at noon and 150 mg in the evening (Patient taking differently: Take 50 mg by mouth 3 (three) times daily. 50 in the morning, 100 at noon and 150 mg in the evening) 300 tablet 1  . losartan (COZAAR) 25 MG tablet Take 1 tablet (  25 mg total) by mouth daily. 30 tablet 1   No current facility-administered medications on file prior to visit.    Allergies:  No Known Allergies  Physical Exam General: Mildly obese young Caucasian male.  Not in distress. Head: head normocephalic and atraumatic.   Neck: supple with no carotid or supraclavicular bruits Cardiovascular: regular rate and rhythm, no murmurs Musculoskeletal: no deformity Skin:  no rash/petichiae Vascular:  Normal pulses all extremities  Neurologic Exam Mental Status: Awake and fully alert. Oriented to place and time. Recent and remote memory intact. Attention span, concentration and fund of knowledge appropriate. Mood and affect appropriate.  Cranial Nerves: Fundoscopic exam reveals sharp disc margins. Pupils equal, briskly reactive to light. Extraocular movements full without nystagmus. Visual fields full to confrontation. Hearing intact. Facial sensation intact. Face, tongue, palate moves normally and symmetrically.  Motor: Normal bulk and tone. Normal strength in all tested extremity muscles. Sensory.: intact to touch , pinprick , position and vibratory sensation.  Coordination: Rapid alternating movements normal in all extremities. Finger-to-nose and  heel-to-shin performed accurately bilaterally. Gait and Station: Arises from chair without difficulty. Stance is normal. Gait demonstrates normal stride length and balance . Able to heel, toe and tandem walk without difficulty.  Reflexes: 1+ and symmetric. Toes downgoing.   NIHSS  0 Modified Rankin  0  ASSESSMENT: 39 year old Caucasian male with transient episode of speech difficulties and right-sided numbness due to left brain subcortical tiny lacunar infarct in setting of hypertensive emergency and malignant hypertension.  Unusual MRI showing extensive confluent periventricular subcortical and brainstem white matter changes likely from chronic small vessel disease.  Work-up for vasculitis and inflammatory condition has been negative.  Vascular risk factors of hypertension, hyperlipidemia, obesity.   PLAN: I had a long discussion with the patient regarding his transient episode of right-sided numbness and speech difficulties likely representing tiny subcortical lacunar infarcts in the setting of hypertensive emergency and malignant hypertension.  He also discussed work-up for vasculitis and inflammatory conditions has been negative.  I recommend he start taking aspirin 81 mg daily for secondary stroke prevention and maintain aggressive risk factor modification with strict control of hypertension with goal below 140/90, lipids with goal below LDL 70 mg percent and diabetes with hemoglobin A1c goal below 6.5%.  He was encouraged to eat a healthy diet with lots of fruits, vegetables, cereals, whole grains and to exercise regularly and lose weight.  I have advised him to follow-up with his nephrologist as well as primary care physician and maintain a strict log of his blood pressure.  And return for follow-up in the future in 3 months with my nurse practitioner Janett Billow or call earlier if necessary. Antony Contras, MD Note: This document was prepared with digital dictation and possible smart phrase  technology. Any transcriptional errors that result from this process are unintentional.

## 2020-06-06 NOTE — Patient Instructions (Signed)
I had a long discussion with the patient regarding his transient episode of right-sided numbness and speech difficulties likely representing tiny subcortical lacunar infarcts in the setting of hypertensive emergency and malignant hypertension.  He also discussed work-up for vasculitis and inflammatory conditions has been negative.  I recommend he start taking aspirin 81 mg daily for secondary stroke prevention and maintain aggressive risk factor modification with strict control of hypertension with goal below 140/90, lipids with goal below LDL 70 mg percent and diabetes with hemoglobin A1c goal below 6.5%.  He was encouraged to eat a healthy diet with lots of fruits, vegetables, cereals, whole grains and to exercise regularly and lose weight.  I have advised him to follow-up with his nephrologist as well as primary care physician and maintain a strict log of his blood pressure.  And return for follow-up in the future in 3 months with my nurse practitioner Shanda Bumps or call earlier if necessary.  Stroke Prevention Some medical conditions and behaviors are associated with a higher chance of having a stroke. You can help prevent a stroke by making nutrition, lifestyle, and other changes, including managing any medical conditions you may have. What nutrition changes can be made?   Eat healthy foods. You can do this by: ? Choosing foods high in fiber, such as fresh fruits and vegetables and whole grains. ? Eating at least 5 or more servings of fruits and vegetables a day. Try to fill half of your plate at each meal with fruits and vegetables. ? Choosing lean protein foods, such as lean cuts of meat, poultry without skin, fish, tofu, beans, and nuts. ? Eating low-fat dairy products. ? Avoiding foods that are high in salt (sodium). This can help lower blood pressure. ? Avoiding foods that have saturated fat, trans fat, and cholesterol. This can help prevent high cholesterol. ? Avoiding processed and premade  foods.  Follow your health care provider's specific guidelines for losing weight, controlling high blood pressure (hypertension), lowering high cholesterol, and managing diabetes. These may include: ? Reducing your daily calorie intake. ? Limiting your daily sodium intake to 1,500 milligrams (mg). ? Using only healthy fats for cooking, such as olive oil, canola oil, or sunflower oil. ? Counting your daily carbohydrate intake. What lifestyle changes can be made?  Maintain a healthy weight. Talk to your health care provider about your ideal weight.  Get at least 30 minutes of moderate physical activity at least 5 days a week. Moderate activity includes brisk walking, biking, and swimming.  Do not use any products that contain nicotine or tobacco, such as cigarettes and e-cigarettes. If you need help quitting, ask your health care provider. It may also be helpful to avoid exposure to secondhand smoke.  Limit alcohol intake to no more than 1 drink a day for nonpregnant women and 2 drinks a day for men. One drink equals 12 oz of beer, 5 oz of wine, or 1 oz of hard liquor.  Stop any illegal drug use.  Avoid taking birth control pills. Talk to your health care provider about the risks of taking birth control pills if: ? You are over 63 years old. ? You smoke. ? You get migraines. ? You have ever had a blood clot. What other changes can be made?  Manage your cholesterol levels. ? Eating a healthy diet is important for preventing high cholesterol. If cholesterol cannot be managed through diet alone, you may also need to take medicines. ? Take any prescribed medicines to control your  cholesterol as told by your health care provider.  Manage your diabetes. ? Eating a healthy diet and exercising regularly are important parts of managing your blood sugar. If your blood sugar cannot be managed through diet and exercise, you may need to take medicines. ? Take any prescribed medicines to control  your diabetes as told by your health care provider.  Control your hypertension. ? To reduce your risk of stroke, try to keep your blood pressure below 130/80. ? Eating a healthy diet and exercising regularly are an important part of controlling your blood pressure. If your blood pressure cannot be managed through diet and exercise, you may need to take medicines. ? Take any prescribed medicines to control hypertension as told by your health care provider. ? Ask your health care provider if you should monitor your blood pressure at home. ? Have your blood pressure checked every year, even if your blood pressure is normal. Blood pressure increases with age and some medical conditions.  Get evaluated for sleep disorders (sleep apnea). Talk to your health care provider about getting a sleep evaluation if you snore a lot or have excessive sleepiness.  Take over-the-counter and prescription medicines only as told by your health care provider. Aspirin or blood thinners (antiplatelets or anticoagulants) may be recommended to reduce your risk of forming blood clots that can lead to stroke.  Make sure that any other medical conditions you have, such as atrial fibrillation or atherosclerosis, are managed. What are the warning signs of a stroke? The warning signs of a stroke can be easily remembered as BEFAST.  B is for balance. Signs include: ? Dizziness. ? Loss of balance or coordination. ? Sudden trouble walking.  E is for eyes. Signs include: ? A sudden change in vision. ? Trouble seeing.  F is for face. Signs include: ? Sudden weakness or numbness of the face. ? The face or eyelid drooping to one side.  A is for arms. Signs include: ? Sudden weakness or numbness of the arm, usually on one side of the body.  S is for speech. Signs include: ? Trouble speaking (aphasia). ? Trouble understanding.  T is for time. ? These symptoms may represent a serious problem that is an emergency. Do not  wait to see if the symptoms will go away. Get medical help right away. Call your local emergency services (911 in the [place] Do not drive yourself to the hospital.  Other signs of stroke may include: ? A sudden, severe headache with no known cause. ? Nausea or vomiting. ? Seizure. Where to find more information For more information, visit:  American Stroke Association: www.strokeassociation.org  National Stroke Association: www.stroke.org Summary  You can prevent a stroke by eating healthy, exercising, not smoking, limiting alcohol intake, and managing any medical conditions you may have.  Do not use any products that contain nicotine or tobacco, such as cigarettes and e-cigarettes. If you need help quitting, ask your health care provider. It may also be helpful to avoid exposure to secondhand smoke.  Remember BEFAST for warning signs of stroke. Get help right away if you or a loved one has any of these signs. This information is not intended to replace advice given to you by your health care provider. Make sure you discuss any questions you have with your health care provider. Document Revised: 08/28/2017 Document Reviewed: 10/21/2016 Elsevier Patient Education  2020 [employer].

## 2020-06-18 ENCOUNTER — Ambulatory Visit: Payer: BC Managed Care – PPO | Admitting: Family Medicine

## 2020-06-21 ENCOUNTER — Ambulatory Visit: Payer: BC Managed Care – PPO | Admitting: Cardiology

## 2020-06-26 ENCOUNTER — Encounter: Payer: Self-pay | Admitting: Cardiology

## 2020-06-26 ENCOUNTER — Other Ambulatory Visit: Payer: Self-pay

## 2020-06-26 ENCOUNTER — Ambulatory Visit (INDEPENDENT_AMBULATORY_CARE_PROVIDER_SITE_OTHER): Payer: BC Managed Care – PPO | Admitting: Cardiology

## 2020-06-26 VITALS — BP 123/80 | HR 55 | Temp 97.0°F | Ht 69.0 in | Wt 224.4 lb

## 2020-06-26 DIAGNOSIS — N1832 Chronic kidney disease, stage 3b: Secondary | ICD-10-CM

## 2020-06-26 DIAGNOSIS — I429 Cardiomyopathy, unspecified: Secondary | ICD-10-CM | POA: Diagnosis not present

## 2020-06-26 DIAGNOSIS — I10 Essential (primary) hypertension: Secondary | ICD-10-CM | POA: Insufficient documentation

## 2020-06-26 DIAGNOSIS — Z8673 Personal history of transient ischemic attack (TIA), and cerebral infarction without residual deficits: Secondary | ICD-10-CM

## 2020-06-26 DIAGNOSIS — Z7189 Other specified counseling: Secondary | ICD-10-CM

## 2020-06-26 MED ORDER — HYDRALAZINE HCL 100 MG PO TABS: 50.0000 mg | ORAL_TABLET | Freq: Two times a day (BID) | ORAL | 1 refills | Status: DC

## 2020-06-26 NOTE — Progress Notes (Signed)
Cardiology Office Note:    Date:  06/26/2020   ID:  Sean Charles, DOB December 20, 1980, MRN [id number]  PCP:  [facility]  Cardiologist:  Buford Dresser, MD  Referring MD: [facility]   CC: post hospital follow up  History of Present Illness:    Sean Charles is a 39 y.o. male with a hx of hypertensive crisis, acute combined heart failure both diagnosed 04/2020 and then readmitted with lacunar infarct and hypertensive emergency who is seen for post hospital follow up. I met him during his hospitalization in 04/2020.  Today: Doing well. Blood pressure has been much better controlled recently. Has had a job change, much less stress, working from home. Hasn't had any low blood pressure numbers. Highest has been 756 systolic once, but mostly 130s before taking medications in the AM.  Currently taking: Amlodipine 10 mg daily in AM, between 7:30-9:30 Carvedilol 25 mg BID, AM and usually between 8-10 pm Chlorthalidone 12.5 mg daily in AM Hydralazine 50 mg BID, AM and PM Losartan 25 mg daily in the AM.  Has been following with nephrology, still CKD 3 but reports that last labs were slightly improved. Working on losing weight, home scale shows 218 lbs (was 245 lbs in the hospital). This has been from a complete change in diet. Walking the dog, golfing without issues. Planning to get a Peloton, do light weight lifting.  Denies chest pain, shortness of breath at rest or with normal exertion. No PND, orthopnea, LE edema or unexpected weight gain. No syncope or palpitations.  Past Medical History:  Diagnosis Date  . Cardiomyopathy ([place])   . Chronic kidney disease    elevated SCr during admit for HTN crisis (first recognized) 04/2020  . Combined systolic & diastolic CHF   . HTN (hypertension) 04/2020    Past Surgical History:  Procedure Laterality Date  . NO PAST SURGERIES      Current Medications: Current Outpatient Medications on File Prior to Visit    Medication Sig  . amLODipine (NORVASC) 10 MG tablet Take 1 tablet (10 mg total) by mouth daily.  Marland Kitchen aspirin EC 81 MG tablet Take 1 tablet (81 mg total) by mouth daily. Swallow whole.  Marland Kitchen atorvastatin (LIPITOR) 40 MG tablet Take 1 tablet (40 mg total) by mouth daily.  . carvedilol (COREG) 25 MG tablet Take 1 tablet (25 mg total) by mouth 2 (two) times daily with a meal.  . chlorthalidone (HYGROTON) 25 MG tablet Take 12.5 mg by mouth daily.  Marland Kitchen losartan (COZAAR) 25 MG tablet Take 1 tablet (25 mg total) by mouth daily.   No current facility-administered medications on file prior to visit.     Allergies:   Patient has no known allergies.   Social History   Tobacco Use  . Smoking status: Former Smoker    Quit date: 2016    Years since quitting: 5.7  . Smokeless tobacco: Never Used  Vaping Use  . Vaping Use: Never used  Substance Use Topics  . Alcohol use: Yes    Alcohol/week: 7.0 standard drinks    Types: 7 Cans of beer per week    Comment: daily  . Drug use: Not Currently    Types: Cocaine    Comment: No use in many years    Family History: family history includes Heart disease in his maternal uncle.  ROS:   Please see the history of present illness.  Additional pertinent ROS: Constitutional: Negative for chills, fever, night sweats, unintentional weight loss  HENT: Negative for ear pain and hearing loss.   Eyes: Negative for loss of vision and eye pain.  Respiratory: Negative for cough, sputum, wheezing.   Cardiovascular: See HPI. Gastrointestinal: Negative for abdominal pain, melena, and hematochezia.  Genitourinary: Negative for dysuria and hematuria.  Musculoskeletal: Negative for falls and myalgias.  Skin: Negative for itching and rash.  Neurological: Negative for focal weakness, focal sensory changes and loss of consciousness.  Endo/Heme/Allergies: Does not bruise/bleed easily.     EKGs/Labs/Other Studies Reviewed:    The following studies were reviewed  today: Echo 05/12/20 1. Left ventricular ejection fraction, by estimation, is 45 to 50%. The  left ventricle has mildly decreased function. The left ventricle has no  regional wall motion abnormalities. There is moderate left ventricular  hypertrophy. Left ventricular  diastolic parameters are consistent with Grade I diastolic dysfunction  (impaired relaxation).  2. Right ventricular systolic function is normal. The right ventricular  size is normal. Tricuspid regurgitation signal is inadequate for assessing  PA pressure.  3. The mitral valve is grossly normal. No evidence of mitral valve  regurgitation. No evidence of mitral stenosis.  4. The aortic valve is tricuspid. Aortic valve regurgitation is not  visualized. No aortic stenosis is present.  5. Aortic dilatation noted. There is mild dilatation of the aortic root  measuring 40 mm.  6. The inferior vena cava is normal in size with greater than 50%  respiratory variability, suggesting right atrial pressure of 3 mmHg.  EKG:  EKG is personally reviewed.  The ekg ordered today demonstrates sinus bradycardia at 56 bpm, LVH  Recent Labs: 05/11/2020: B Natriuretic Peptide 396.7 05/12/2020: Magnesium 2.1 05/15/2020: ALT 52; Hemoglobin 13.9; Platelets 131 05/16/2020: BUN 34; Creatinine, Ser 2.25; Potassium 3.7; Sodium 136  Recent Lipid Panel    Component Value Date/Time   CHOL 189 05/16/2020 0143   TRIG 168 (H) 05/16/2020 0143   HDL 32 (L) 05/16/2020 0143   CHOLHDL 5.9 05/16/2020 0143   VLDL 34 05/16/2020 0143   LDLCALC 123 (H) 05/16/2020 0143    Physical Exam:    VS:  BP 123/80   Pulse (!) 55   Temp (!) 97 F (36.1 C)   Ht 5' 9" (1.753 m)   Wt 224 lb 6.4 oz (101.8 kg)   SpO2 99%   BMI 33.14 kg/m     Wt Readings from Last 3 Encounters:  06/26/20 224 lb 6.4 oz (101.8 kg)  06/06/20 228 lb (103.4 kg)  05/15/20 234 lb 12.6 oz (106.5 kg)    GEN: Well nourished, well developed in no acute distress HEENT: Normal, moist  mucous membranes NECK: No JVD CARDIAC: regular rhythm, normal S1 and S2, no rubs or gallops. No murmurs. VASCULAR: Radial and DP pulses 2+ bilaterally. No carotid bruits RESPIRATORY:  Clear to auscultation without rales, wheezing or rhonchi  ABDOMEN: Soft, non-tender, non-distended MUSCULOSKELETAL:  Ambulates independently SKIN: Warm and dry, no edema NEUROLOGIC:  Alert and oriented x 3. No focal neuro deficits noted. PSYCHIATRIC:  Normal affect    ASSESSMENT:    1. Essential hypertension   2. Cardiomyopathy, unspecified type ([place])   3. History of CVA (cerebrovascular accident)   4. Stage 3b chronic kidney disease ([place])   5. Cardiac risk counseling   6. Counseling on health promotion and disease prevention    PLAN:    Hypertension, complicated by hypertensive emergency and CVA. Noted to have abnormal kidney function during admission, now thought to be chronic kidney disease stage 3: -following with  [facility] as well -blood pressure well controlled today -continue blood pressure medications as listed above -with history of stroke, continue aspirin and atorvastatin  Cardiomyopathy -high suspicion that this is hypertension-induced, but due to renal disease there are no plans for cath -continue ARB, carvedilol -recheck echo in 3 mos  Cardiac risk counseling and prevention recommendations: -recommend heart healthy/Mediterranean diet, with whole grains, fruits, vegetable, fish, lean meats, nuts, and olive oil. Limit salt. -recommend moderate walking, 3-5 times/week for 30-50 minutes each session. Aim for at least 150 minutes.week. Goal should be pace of 3 miles/hours, or walking 1.5 miles in 30 minutes -recommend avoidance of tobacco products. Avoid excess alcohol.  Plan for follow up: 3 mos, after echo  Buford Dresser, MD, PhD [place]  [facility]    Medication Adjustments/Labs and Tests Ordered: Current medicines are reviewed at length with the patient  today.  Concerns regarding medicines are outlined above.  Orders Placed This Encounter  Procedures  . EKG 12-Lead  . ECHOCARDIOGRAM COMPLETE   Meds ordered this encounter  Medications  . hydrALAZINE (APRESOLINE) 100 MG tablet    Sig: Take 0.5 tablets (50 mg total) by mouth 2 (two) times daily. 50 in the morning, 100 at noon and 150 mg in the evening    Dispense:  300 tablet    Refill:  1    Patient Instructions  Medication Instructions:  Your Physician recommend you continue on your current medication as directed.    *If you need a refill on your cardiac medications before your next appointment, please call your pharmacy*   Lab Work: None ordered   Testing/Procedures: Your physician has requested that you have an echocardiogram in 3 months. Echocardiography is a painless test that uses sound waves to create images of your heart. It provides your doctor with information about the size and shape of your heart and how well your heart's chambers and valves are working. This procedure takes approximately one hour. There are no restrictions for this procedure. [place] [address]    Follow-Up: At [facility], you and your health needs are our priority.  As part of our continuing mission to provide you with exceptional heart care, we have created designated Provider Care Teams.  These Care Teams include your primary Cardiologist (physician) and Advanced Practice Providers (APPs -  Physician Assistants and Nurse Practitioners) who all work together to provide you with the care you need, when you need it.  We recommend signing up for the patient portal called "MyChart".  Sign up information is provided on this After Visit Summary.  MyChart is used to connect with patients for Virtual Visits (Telemedicine).  Patients are able to view lab/test results, encounter notes, upcoming appointments, etc.  Non-urgent messages can be sent to your provider as well.   To learn more about  what you can do with MyChart, go to [email or web address].    Your next appointment:   3 month(s)  The format for your next appointment:   In Person  Provider:   Buford Dresser, MD    Signed, Buford Dresser, MD PhD 06/26/2020 1:36 PM    [place]

## 2020-06-26 NOTE — Patient Instructions (Signed)
Medication Instructions:  Your Physician recommend you continue on your current medication as directed.    *If you need a refill on your cardiac medications before your next appointment, please call your pharmacy*   Lab Work: None ordered  Testing/Procedures: Your physician has requested that you have an echocardiogram in 3 months. Echocardiography is a painless test that uses sound waves to create images of your heart. It provides your doctor with information about the size and shape of your heart and how well your heart's chambers and valves are working. This procedure takes approximately one hour. There are no restrictions for this procedure. [address]     Follow-Up: At [facility], you and your health needs are our priority.  As part of our continuing mission to provide you with exceptional heart care, we have created designated Provider Care Teams.  These Care Teams include your primary Cardiologist (physician) and Advanced Practice Providers (APPs -  Physician Assistants and Nurse Practitioners) who all work together to provide you with the care you need, when you need it.  We recommend signing up for the patient portal called "MyChart".  Sign up information is provided on this After Visit Summary.  MyChart is used to connect with patients for Virtual Visits (Telemedicine).  Patients are able to view lab/test results, encounter notes, upcoming appointments, etc.  Non-urgent messages can be sent to your provider as well.   To learn more about what you can do with MyChart, go to [email or web address].    Your next appointment:   3 month(s)  The format for your next appointment:   In Person  Provider:   Bridgette Christopher, MD    

## 2020-07-27 ENCOUNTER — Other Ambulatory Visit: Payer: Self-pay | Admitting: Internal Medicine

## 2020-07-27 DIAGNOSIS — I129 Hypertensive chronic kidney disease with stage 1 through stage 4 chronic kidney disease, or unspecified chronic kidney disease: Secondary | ICD-10-CM

## 2020-07-27 DIAGNOSIS — I5042 Chronic combined systolic (congestive) and diastolic (congestive) heart failure: Secondary | ICD-10-CM

## 2020-07-27 DIAGNOSIS — N183 Chronic kidney disease, stage 3 unspecified: Secondary | ICD-10-CM

## 2020-09-17 ENCOUNTER — Ambulatory Visit: Payer: BC Managed Care – PPO | Admitting: Adult Health

## 2020-09-18 ENCOUNTER — Other Ambulatory Visit (HOSPITAL_COMMUNITY): Payer: BC Managed Care – PPO

## 2020-10-03 ENCOUNTER — Ambulatory Visit: Payer: BC Managed Care – PPO | Admitting: Cardiology

## 2020-10-08 ENCOUNTER — Other Ambulatory Visit: Payer: Self-pay

## 2020-10-09 ENCOUNTER — Other Ambulatory Visit: Payer: Self-pay

## 2020-10-12 ENCOUNTER — Other Ambulatory Visit (HOSPITAL_COMMUNITY): Payer: Self-pay

## 2020-10-16 ENCOUNTER — Ambulatory Visit: Payer: Self-pay | Admitting: Adult Health

## 2020-10-19 ENCOUNTER — Ambulatory Visit: Payer: Self-pay | Admitting: Cardiology

## 2020-11-07 ENCOUNTER — Other Ambulatory Visit: Payer: Self-pay

## 2020-11-13 ENCOUNTER — Other Ambulatory Visit (HOSPITAL_COMMUNITY): Payer: Self-pay

## 2020-11-13 ENCOUNTER — Encounter (HOSPITAL_COMMUNITY): Payer: Self-pay | Admitting: Cardiology

## 2020-11-13 ENCOUNTER — Encounter (HOSPITAL_COMMUNITY): Payer: Self-pay

## 2020-11-13 NOTE — Progress Notes (Signed)
Verified appointment "no show" status with R. Cathey at 09:27.  

## 2020-11-19 ENCOUNTER — Ambulatory Visit: Payer: Self-pay | Admitting: Adult Health

## 2020-11-19 ENCOUNTER — Encounter: Payer: Self-pay | Admitting: Adult Health

## 2020-11-19 NOTE — Progress Notes (Deleted)
[facility] [address] [place] [address] [phone]       STROKE FOLLOW UP NOTE  Mr. Antoine Vandermeulen Date of Birth:  July 31, 1981 Medical Record Number:  [id number]   Referring MD: Kathrynn Speed  Reason for Referral: Stroke  No chief complaint on file.    HPI:   Today, 11/19/2020, Mr. Kiester returns for stroke follow-up after prior visit approximately 5 months ago.   Denies new or recurring stroke/TIA symptoms Reports compliance on aspirin 81 mg daily and atorvastatin 40 mg daily -denies side effects Blood pressure today ***      History provided for reference purposes only Initial visit 06/06/2020 Dr. Leonie Man: Mr. Vanatta is a 40 year old Caucasian male seen today for initial office consultation visit for stroke.  History is obtained from the patient, review of electronic medical records and I personally reviewed available imaging films in PACS.  Patient states he was not aware that he had history of hypertension until 3 weeks ago on 05/11/2020.  He was seen at [facility] emergency room with significantly elevated blood pressure of 223/148.  EKG showed evidence of left ventricular hypertrophy with QTC prolongation it was treated as hypertensive emergency and he feels a blood pressure was brought down too quickly.  He was he was admitted for a few days and blood pressure was brought down and discharge diagnoses include hypertensive crisis and acute kidney injury and acute on chronic congestive heart failure.  Echocardiogram showed left ventricular ejection fraction of 45 to 50% with grade 1 diastolic dysfunction.  Labs showed elevated BNP and mildly elevated troponin and he complained of paroxysmal nocturnal dyspnea and orthopnea and shortness of breath on exertion.  Cardiology was consulted and he was discharged home on 05/14/2016.  He felt his blood pressure was low too much and he presented back to the hospital the next day as a code stroke with sudden onset  of speech difficulties.  CT scan of the head showed no acute abnormalities extensive white matter changes.  CT angiogram of the brain and neck showed no significant large vessel stenosis or occlusion.  MRI scan of the brain showed slightly unusual pattern of extensive confluent white matter hyperintensities on T2/FLAIR involving bilateral periventricular and subcortical regions including the brainstem.  There were also tiny punctate weakly diffusion positive lesions in bilateral deep white matter but it was unclear to me whether this represents acute strokes or just T2 shine through.  Patient underwent a spinal tap to look for vasculitis but the spinal fluid was completely normal with normal protein glucose and absence of cells.  Urine metanephrines were slightly elevated but this was of unclear significance in setting of hypertensive crisis.  Renal ultrasound showed no evidence of renal artery stenosis.  LDL cholesterol elevated 126 mg percent and hemoglobin A1c was 4.6.  ANA, antineutrophilic antibodies were negative.  ESR was 9 mm C-reactive protein was normal.  Patient states is done well since discharge she is started eating healthy he is cut out salt and red meat.  He has lost about 20 pounds.  Is eating a healthy diet.  He has been monitoring his blood pressure very well.  In fact today it is quite low at 109/6 7 in office.  He is on 5 different blood pressure medications.  He does have upcoming appointments with nephrologist as well as primary care physician to discuss his blood pressure medications.  Patient states that he did have some transient speech difficulties prompting his second admission but  this resolved by the time he reached the ER.  He has had no further recurrent stroke or TIA symptoms.  He denies any prior history of rash, tick bite, high risk sexual behavior, drug abuse.  There is no family history of strokes or heart attacks in a young age.    ROS:   14 system review of systems is  positive for numbness, tingling, weakness, gait difficulty all other systems negative  PMH:  Past Medical History:  Diagnosis Date  . Cardiomyopathy ([place])   . Chronic kidney disease    elevated SCr during admit for HTN crisis (first recognized) 04/2020  . Combined systolic & diastolic CHF   . HTN (hypertension) 04/2020    Social History:  Social History   Socioeconomic History  . Marital status: Married    Spouse name: Not on file  . Number of children: 1  . Years of education: Not on file  . Highest education level: Not on file  Occupational History  . Occupation: Geophysical data processor  Tobacco Use  . Smoking status: Former Smoker    Quit date: 2016    Years since quitting: 6.1  . Smokeless tobacco: Never Used  Vaping Use  . Vaping Use: Never used  Substance and Sexual Activity  . Alcohol use: Yes    Alcohol/week: 7.0 standard drinks    Types: 7 Cans of beer per week    Comment: daily  . Drug use: Not Currently    Types: Cocaine    Comment: No use in many years  . Sexual activity: Not on file  Other Topics Concern  . Not on file  Social History Narrative   Former Armed forces training and education officer   Social Determinants of Health   Financial Resource Strain: Not on file  Food Insecurity: Not on file  Transportation Needs: Not on file  Physical Activity: Not on file  Stress: Not on file  Social Connections: Not on file  Intimate Partner Violence: Not on file    Medications:   Current Outpatient Medications on File Prior to Visit  Medication Sig Dispense Refill  . amLODipine (NORVASC) 10 MG tablet Take 1 tablet (10 mg total) by mouth daily. 30 tablet 1  . aspirin EC 81 MG tablet Take 1 tablet (81 mg total) by mouth daily. Swallow whole. 30 tablet 11  . atorvastatin (LIPITOR) 40 MG tablet Take 1 tablet (40 mg total) by mouth daily. 30 tablet 1  . carvedilol (COREG) 25 MG tablet Take 1 tablet (25 mg total) by mouth 2 (two) times daily with a meal. 60 tablet 1  . chlorthalidone (HYGROTON) 25  MG tablet Take 12.5 mg by mouth daily.    . hydrALAZINE (APRESOLINE) 100 MG tablet Take 0.5 tablets (50 mg total) by mouth 2 (two) times daily. 50 in the morning, 100 at noon and 150 mg in the evening 300 tablet 1  . losartan (COZAAR) 25 MG tablet Take 1 tablet (25 mg total) by mouth daily. 30 tablet 1   No current facility-administered medications on file prior to visit.    Allergies:  No Known Allergies  Physical Exam There were no vitals filed for this visit. There is no height or weight on file to calculate BMI.    General: Mildly obese young Caucasian male.  Not in distress. Head: head normocephalic and atraumatic.   Neck: supple with no carotid or supraclavicular bruits Cardiovascular: regular rate and rhythm, no murmurs Musculoskeletal: no deformity Skin:  no rash/petichiae Vascular:  Normal pulses all  extremities  Neurologic Exam Mental Status: Awake and fully alert. Oriented to place and time. Recent and remote memory intact. Attention span, concentration and fund of knowledge appropriate. Mood and affect appropriate.  Cranial Nerves: Fundoscopic exam reveals sharp disc margins. Pupils equal, briskly reactive to light. Extraocular movements full without nystagmus. Visual fields full to confrontation. Hearing intact. Facial sensation intact. Face, tongue, palate moves normally and symmetrically.  Motor: Normal bulk and tone. Normal strength in all tested extremity muscles. Sensory.: intact to touch , pinprick , position and vibratory sensation.  Coordination: Rapid alternating movements normal in all extremities. Finger-to-nose and heel-to-shin performed accurately bilaterally. Gait and Station: Arises from chair without difficulty. Stance is normal. Gait demonstrates normal stride length and balance . Able to heel, toe and tandem walk without difficulty.  Reflexes: 1+ and symmetric. Toes downgoing.      ASSESSMENT/PLAN: 40 year old Caucasian male with transient episode of  speech difficulties and right-sided numbness due to left brain subcortical tiny lacunar infarct in setting of hypertensive emergency and malignant hypertension.  Unusual MRI showing extensive confluent periventricular subcortical and brainstem white matter changes likely from chronic small vessel disease.  Work-up for vasculitis and inflammatory condition has been negative.  Vascular risk factors of hypertension, hyperlipidemia, obesity.   -Denies any additional stroke/TIA symptoms -Continue aspirin 81 mg daily and atorvastatin 40 mg daily for secondary stroke prevention -Discussed secondary stroke prevention measures and importance of close PCP follow-up for aggressive stroke risk factor management -HTN: BP goal<130/90.  Controlled on amlodipine, carvedilol, hydralazine, losartan and hygroton per PCP/nephrology/cardiology -HLD: LDL goal<70.  LDL 123 (04/2020) on atorvastatin 40 mg daily per PCP       CC:  [facility] provider: Dr. Leonie Man [facility]   I spent *** minutes of face-to-face and non-face-to-face time with patient.  This included previsit chart review, lab review, study review, order entry, electronic health record documentation, patient education and discussion regarding history of prior stroke, continue current treatment regimen importance of aggressive stroke risk factor management and answered all other questions to patient satisfaction  Frann Rider, [facility]  [facility] [address] [place], [place] [address]  Phone [phone] Fax [phone] Note: This document was prepared with digital dictation and possible smart phrase technology. Any transcriptional errors that result from this process are unintentional.

## 2020-11-21 ENCOUNTER — Ambulatory Visit: Payer: Self-pay | Admitting: Cardiology

## 2020-11-27 ENCOUNTER — Telehealth (HOSPITAL_COMMUNITY): Payer: Self-pay | Admitting: Cardiology

## 2020-11-27 NOTE — Telephone Encounter (Signed)
Just an FYI. We have made several attempts to contact this patient including sending a letter to schedule or reschedule their echocardiogram. We will be removing the patient from the echo WQ.  11/13/20 NO SHOW-MAILED LETTER [facility]    Thank you

## 2020-12-05 ENCOUNTER — Ambulatory Visit: Payer: Self-pay | Admitting: Cardiology

## 2020-12-07 ENCOUNTER — Other Ambulatory Visit: Payer: Self-pay

## 2020-12-10 ENCOUNTER — Ambulatory Visit
Admission: RE | Admit: 2020-12-10 | Discharge: 2020-12-10 | Disposition: A | Discharge status: Home / Self Care | Payer: 59 | Source: Ambulatory Visit | Attending: Internal Medicine | Admitting: Internal Medicine

## 2020-12-10 DIAGNOSIS — I5042 Chronic combined systolic (congestive) and diastolic (congestive) heart failure: Secondary | ICD-10-CM

## 2020-12-10 DIAGNOSIS — I129 Hypertensive chronic kidney disease with stage 1 through stage 4 chronic kidney disease, or unspecified chronic kidney disease: Secondary | ICD-10-CM

## 2020-12-10 DIAGNOSIS — N183 Chronic kidney disease, stage 3 unspecified: Secondary | ICD-10-CM

## 2020-12-25 ENCOUNTER — Ambulatory Visit: Payer: Self-pay | Admitting: Cardiology

## 2021-08-03 IMAGING — CT CT HEAD CODE STROKE
4 series · 16 of 47 positions shown, 18 images · non-contrast
Comparison: None.

CLINICAL DATA: Code stroke.  Aphasia

EXAM:
CT HEAD WITHOUT CONTRAST
TECHNIQUE: Contiguous axial images were obtained from the base of the skull
through the vertex without intravenous contrast.

[Series 3: head without · axial · non-contrast · 0.44mm/px · z∈[-178,-52]mm · 7 of 35 slices shown, 9 images]
[im 5/35  brain]
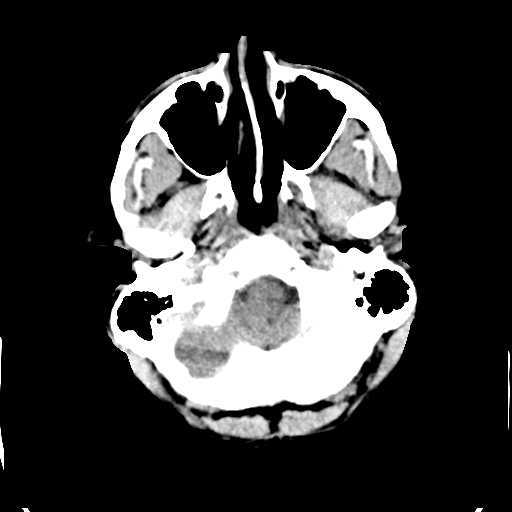
[im 5/35  bone]
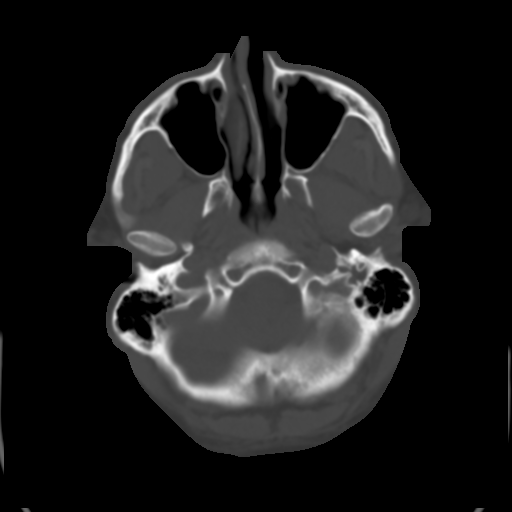
[im 9/35  brain]
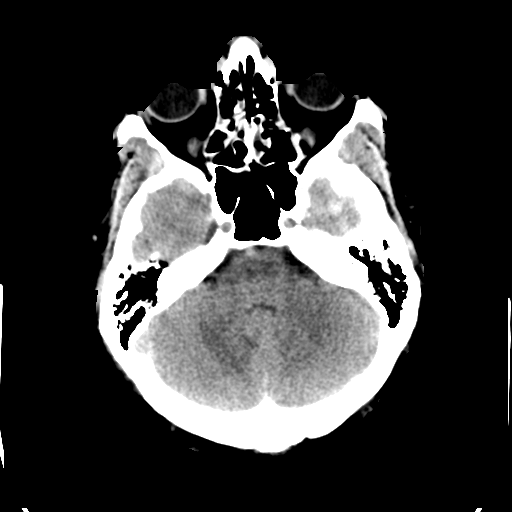
[im 13/35  brain]
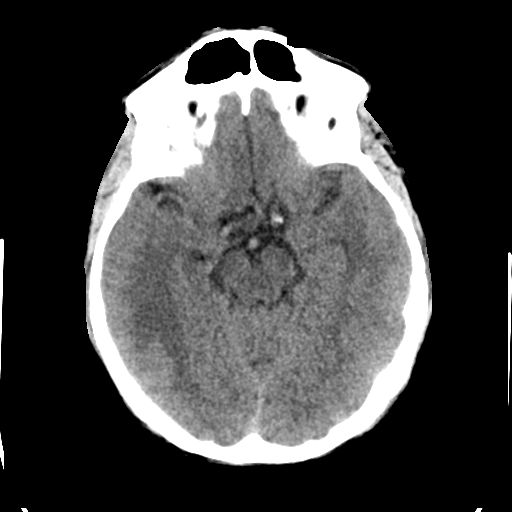
[im 18/35  brain]
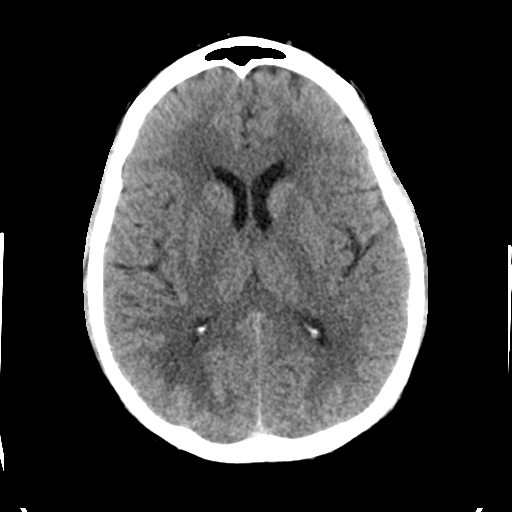
[im 22/35  brain]
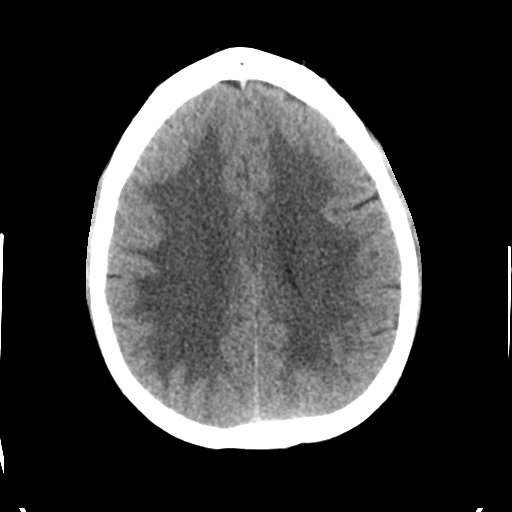
[im 22/35  bone]
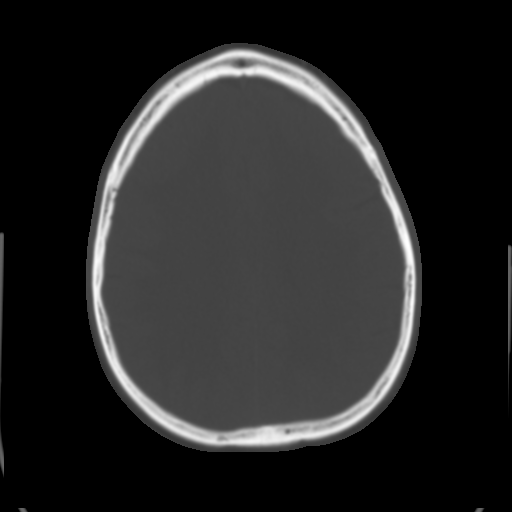
[im 26/35  brain]
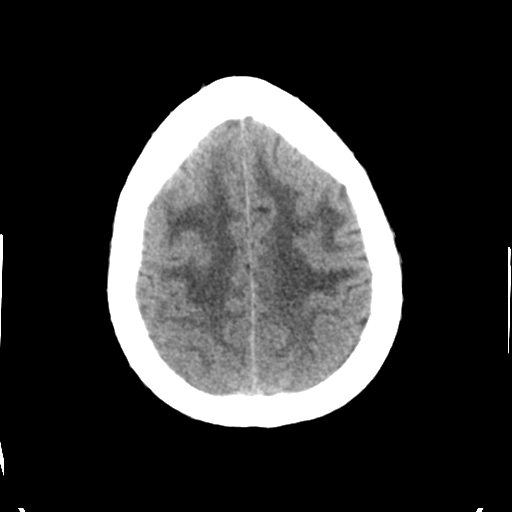
[im 30/35  brain]
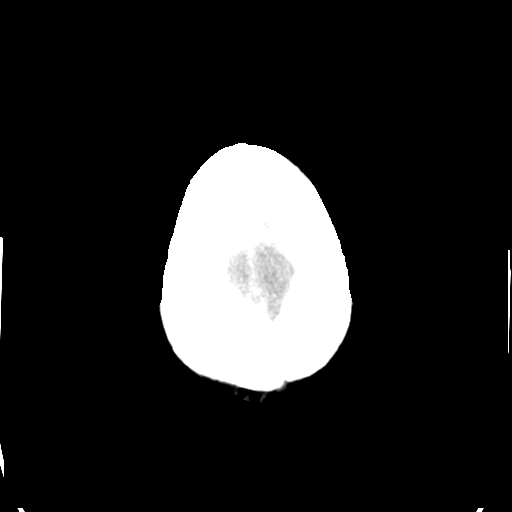

[Series 4: head bone · axial · 0.44mm/px · z∈[-182,-148]mm · 3 of 87 slices shown]
[im 9/87  bone]
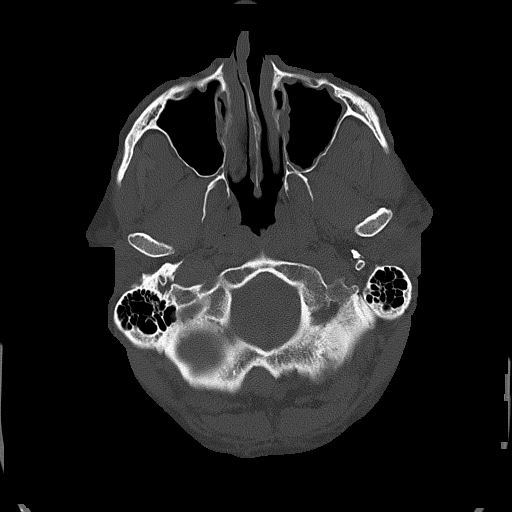
[im 18/87  bone]
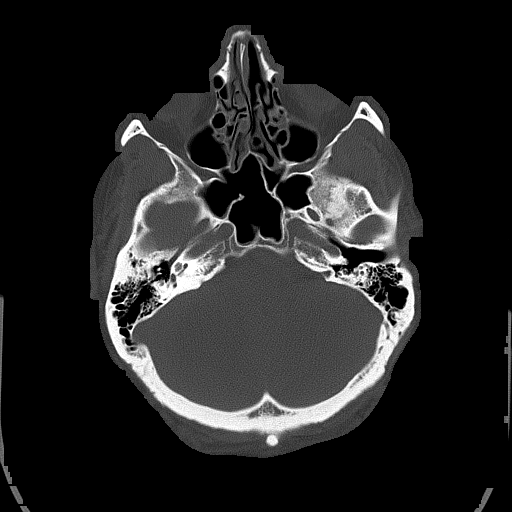
[im 26/87  bone]
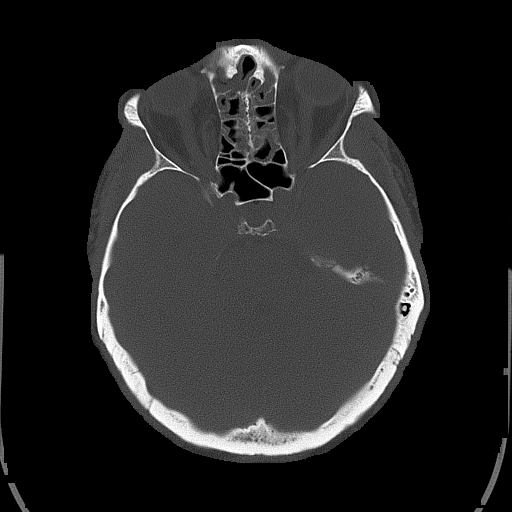

[Series 5: head without cor · coronal · non-contrast · 0.34mm/px · 3 of 72 slices shown]
[im 24/72  brain]
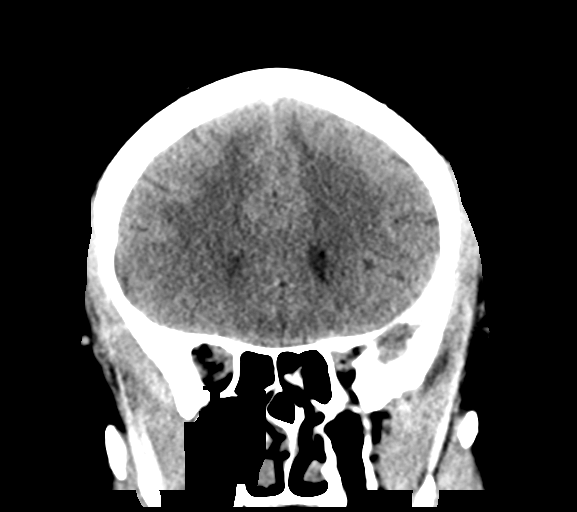
[im 32/72  brain]
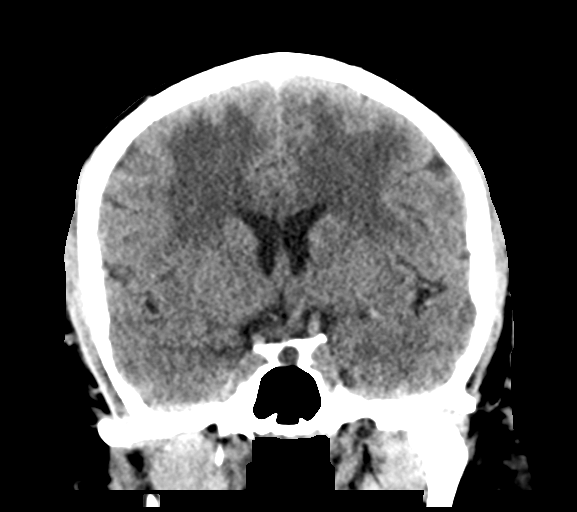
[im 40/72  brain]
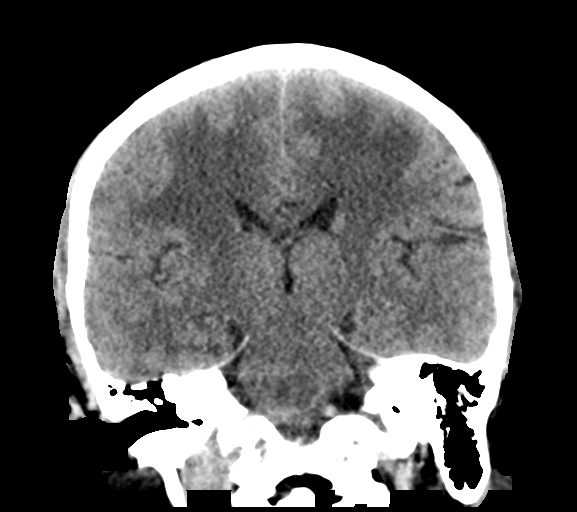

[Series 6: head without sag · sagittal · non-contrast · 0.35mm/px · 3 of 66 slices shown]
[im 22/66  brain]
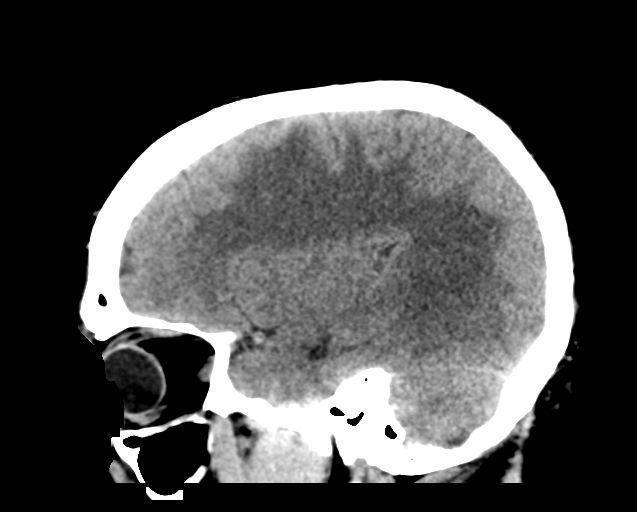
[im 33/66  brain]
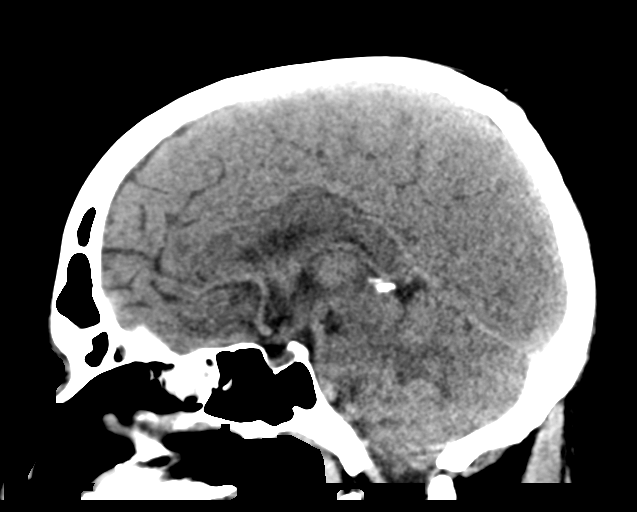
[im 44/66  brain]
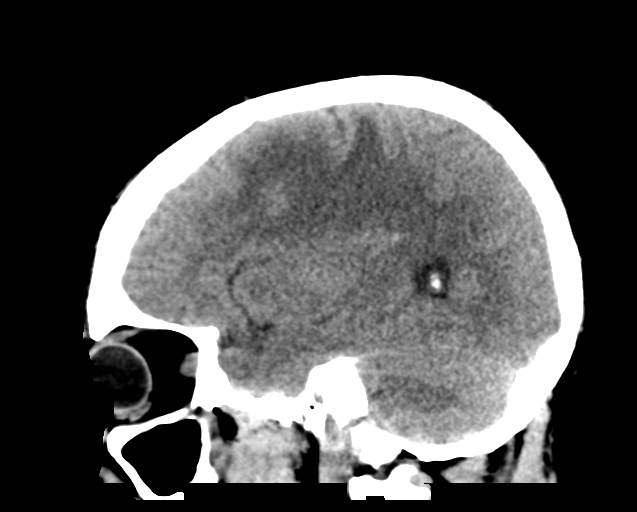

[16 of 47 positions shown; findings below may reference images not displayed]

FINDINGS: Brain: There is diffuse white matter hypoattenuation throughout both
cerebral hemispheres. There is a focal area of hypoattenuation in
the right cerebellum. No acute hemorrhage. No hydrocephalus.

Vascular: No abnormal hyperdensity of the major intracranial
arteries or dural venous sinuses. No intracranial atherosclerosis.

Skull: The visualized skull base, calvarium and extracranial soft
tissues are normal.

Sinuses/Orbits: No fluid levels or advanced mucosal thickening of
the visualized paranasal sinuses. No mastoid or middle ear effusion.
The orbits are normal.

ASPECTS (Alberta Stroke Program Early CT Score)

- Ganglionic level infarction (caudate, lentiform nuclei, internal
capsule, insula, M1-M3 cortex): 7

- Supraganglionic infarction (M4-M6 cortex): 3

Total score (0-10 with 10 being normal): 10
IMPRESSION: 1. Diffuse white matter hypoattenuation throughout both cerebral
hemispheres with focal area of hypoattenuation in the right
cerebellum. The findings are most suggestive of PRES superimposed on
chronic hypertensive white matter changes. A superimposed right
cerebellar infarct would be difficult to exclude.
2. No acute hemorrhage.
3. ASPECTS is 10.

These results were called by telephone at the time of interpretation
on 05/15/2020 at [DATE] to provider Dr. Isak Geffrard, Who verbally
acknowledged these results.

## 2022-05-12 ENCOUNTER — Other Ambulatory Visit: Payer: Self-pay | Admitting: Surgery

## 2022-05-13 ENCOUNTER — Telehealth (HOSPITAL_BASED_OUTPATIENT_CLINIC_OR_DEPARTMENT_OTHER): Payer: Self-pay

## 2022-05-13 NOTE — Telephone Encounter (Signed)
   Name: Sean Charles  DOB: 1981-06-05  MRN: [id number]  Primary Cardiologist: Jodelle Red, MD  Chart reviewed as part of pre-operative protocol coverage. Because of Sean Charles's past medical history and time since last visit, he will require a follow-up in-office visit in order to better assess preoperative cardiovascular risk.  Pre-op covering staff: - Please schedule appointment and call patient to inform them. If patient already had an upcoming appointment within acceptable timeframe, please add "pre-op clearance" to the appointment notes so provider is aware. - Please contact requesting surgeon's office via preferred method (i.e, phone, fax) to inform them of need for appointment prior to surgery.   Joylene Grapes, NP  05/13/2022, 4:07 PM

## 2022-05-13 NOTE — Telephone Encounter (Signed)
S/w the pt and he is agreeable to in office appt. Pt said procedure is not urgent and preferred to schedule in Sept.   Appt is 06/04/22 @ 8:50 with Gillian Shields, NP at the [facility] location, pt is aware or location for [facility].

## 2022-05-13 NOTE — Telephone Encounter (Signed)
   Pre-operative Risk Assessment    Patient Name: Sean Charles  DOB: [birth year] MRN: [id number]      Request for Surgical Clearance    Procedure:   Left Inguinal Hernia Repair  Date of Surgery:  Clearance TBD                                 Surgeon:  Abigail Miyamoto, MD Surgeon's Group or Practice Name:  [facility] Phone number:  [phone] Fax number:  [phone]   Type of Clearance Requested:   - Medical    Type of Anesthesia:  General    Additional requests/questions:   Fax note of Cardiac Clearance to [phone] ATTN: Brendia Sacks, MA  Signed, Jeanene Erb   05/13/2022, 8:47 AM

## 2022-05-14 NOTE — Telephone Encounter (Signed)
Noted. Will address at clinic visit.   Daylene Vandenbosch S Kellan Raffield, NP  

## 2022-06-03 NOTE — Progress Notes (Unsigned)
Office Visit    Patient Name: Sean Charles Date of Encounter: 06/04/2022  PCP:  Irven Coe, MD   [facility]  Cardiologist:  Jodelle Red, MD  Advanced Practice Provider:  No care team member to display Electrophysiologist:  None      Chief Complaint    Sean Charles is a 41 y.o. male presents today for preop clearance   Past Medical History    Past Medical History:  Diagnosis Date   Cardiomyopathy [facility])    Chronic kidney disease    elevated SCr during admit for HTN crisis (first recognized) 04/2020   Combined systolic & diastolic CHF    HTN (hypertension) 04/2020   Past Surgical History:  Procedure Laterality Date   NO PAST SURGERIES      Allergies  No Known Allergies  History of Present Illness    Sean Charles is a 41 y.o. male with a hx of hypertensive heart disease with heart failure, CVA (lacunar infarct), CKD3, obesity last seen 06/26/20 by Dr.  Cristal Deer.  Admitted 04/2020 and diagnosed with acute combined systolic and diastolic heart failure. Echo EF 45-50%. Subsequently readmitted with lacunar infarct and hypertensive urgency. Established with Dr. Cristal Deer while admitted. Hospital follow up 05/2020 BP was better controlled in setting of less stressful job and exercising. He was recommended for echocardiogram and follow up in 3 months which was not completed.  Regimen at that time: Amlodipine 10mg  QD, Coreg 25mg  BID, Chlorthalidone 12.5mg  QD, Hydralazine 50mg  BID, Losartan 25mg  QD.  He presents today for preop clearance for inguinal hernia repair.  Pleasant gentleman who lives with his wife and two sons who are 35 years old and nearly 49 months old. Has been following with his primary care provider and gradually able to discontinue amlodipine, hydralazine.  As of last week he stopped carvedilol.  Exercising at [employer] without exertional chest discomfort nor shortness of breath.  Endorses eating an overall heart healthy, low-sodium  diet.  Reports no shortness of breath nor dyspnea on exertion. Reports no chest pain, pressure, or tightness. No edema, orthopnea, PND. Reports no palpitations.     EKGs/Labs/Other Studies Reviewed:   The following studies were reviewed today:  Echo 05/12/20  1. Left ventricular ejection fraction, by estimation, is 45 to 50%. The  left ventricle has mildly decreased function. The left ventricle has no  regional wall motion abnormalities. There is moderate left ventricular  hypertrophy. Left ventricular  diastolic parameters are consistent with Grade I diastolic dysfunction  (impaired relaxation).   2. Right ventricular systolic function is normal. The right ventricular  size is normal. Tricuspid regurgitation signal is inadequate for assessing  PA pressure.   3. The mitral valve is grossly normal. No evidence of mitral valve  regurgitation. No evidence of mitral stenosis.   4. The aortic valve is tricuspid. Aortic valve regurgitation is not  visualized. No aortic stenosis is present.   5. Aortic dilatation noted. There is mild dilatation of the aortic root  measuring 40 mm.   6. The inferior vena cava is normal in size with greater than 50%  respiratory variability, suggesting right atrial pressure of 3 mmHg.   EKG:  EKG is ordered today.  The ekg ordered today demonstrates NSR 73 bpm with no acute ST/T wave changes.   Recent Labs: No results found for requested labs within last 365 days.  Recent Lipid Panel    Component Value Date/Time   CHOL 189 05/16/2020 0143   TRIG 168 (H)  05/16/2020 0143   HDL 32 (L) 05/16/2020 0143   CHOLHDL 5.9 05/16/2020 0143   VLDL 34 05/16/2020 0143   LDLCALC 123 (H) 05/16/2020 0143   Home Medications   Current Meds  Medication Sig   aspirin EC 81 MG tablet Take 1 tablet (81 mg total) by mouth daily. Swallow whole.   atorvastatin (LIPITOR) 40 MG tablet Take 1 tablet (40 mg total) by mouth daily.   chlorthalidone (HYGROTON) 25 MG tablet Take  12.5 mg by mouth daily.   losartan (COZAAR) 25 MG tablet Take 1 tablet (25 mg total) by mouth daily.     Review of Systems      All other systems reviewed and are otherwise negative except as noted above.  Physical Exam    VS:  BP (!) 124/96   Pulse 73   Ht 5\' 9"  (1.753 m)   Wt 236 lb (107 kg)   BMI 34.85 kg/m  , BMI Body mass index is 34.85 kg/m.  Wt Readings from Last 3 Encounters:  06/04/22 236 lb (107 kg)  06/26/20 224 lb 6.4 oz (101.8 kg)  06/06/20 228 lb (103.4 kg)    GEN: Well nourished, well developed, in no acute distress. HEENT: normal. Neck: Supple, no JVD, carotid bruits, or masses. Cardiac: RRR, no murmurs, rubs, or gallops. No clubbing, cyanosis, edema.  Radials/PT 2+ and equal bilaterally.  Respiratory:  Respirations regular and unlabored, clear to auscultation bilaterally. GI: Soft, nontender, nondistended. MS: No deformity or atrophy. Skin: Warm and dry, no rash. Neuro:  Strength and sensation are intact. Psych: Normal affect.  Assessment & Plan    Preop Clearance - Upcoming left inguinal hernia repair with Dr. 08/06/20 of [facility]. Stable with no anginal symptoms. No indication for ischemic evaluation.  According to the Revised Cardiac Risk Index (RCRI), his Perioperative Risk of Major Cardiac Event is (%): 6.6. His  Functional Capacity in METs is: 7.99 according to the [facility] Activity Status Index (DASI).  He is deemed acceptable risk for the planned procedure without additional cardiovascular testing.  Will route to surgical team via epic fax function so they are aware.  May hold aspirin if desired at the direction of surgical team.  Hypertensive heart disease with heart failure - Euvolemic and well compensated on exam. 04/2020 EF 45-50% in setting of hypertensive crisis. 11/2020 renal duplex with no stenosis.  BP reasonably well controlled today the reports routinely at goal less than 130/80 at home. Maintained on Chlorthalidone 12.5mg  QD,  Losartan 25mg  QD. PCP recently stopped Carvedilol to simplify medication regimen. Update echo to reassess LVEF. Does not need to be completed prior to surgery. If EF normalized, continue current regimen. If EF not normalized, consider adjustment for GDMT and possible cardiac CTA.  Hx of CVA - Continue Aspirin, Atorvastatin.   CKD3 - Careful titration of diuretic and antihypertensive. Follows with nephrology.  HLD, LDL goal <70 - Continue Atorvastatin. Lipids followed by PCP.    Disposition: Follow up in 1 year(s) with 12/2020, MD or APP.  Signed, , NP 06/04/2022, 8:56 AM [facility]

## 2022-06-04 ENCOUNTER — Ambulatory Visit (INDEPENDENT_AMBULATORY_CARE_PROVIDER_SITE_OTHER): Payer: Commercial Managed Care - HMO | Admitting: Family

## 2022-06-04 ENCOUNTER — Encounter (HOSPITAL_BASED_OUTPATIENT_CLINIC_OR_DEPARTMENT_OTHER): Payer: Self-pay | Admitting: Family

## 2022-06-04 VITALS — BP 124/96 | HR 73 | Ht 69.0 in | Wt 236.0 lb

## 2022-06-04 DIAGNOSIS — N1832 Chronic kidney disease, stage 3b: Secondary | ICD-10-CM

## 2022-06-04 DIAGNOSIS — I130 Hypertensive heart and chronic kidney disease with heart failure and stage 1 through stage 4 chronic kidney disease, or unspecified chronic kidney disease: Secondary | ICD-10-CM

## 2022-06-04 DIAGNOSIS — Z8673 Personal history of transient ischemic attack (TIA), and cerebral infarction without residual deficits: Secondary | ICD-10-CM

## 2022-06-04 DIAGNOSIS — I10 Essential (primary) hypertension: Secondary | ICD-10-CM

## 2022-06-04 NOTE — Patient Instructions (Signed)
Medication Instructions:  Continue your current medications.   May hold Aspirin prior to surgery if needed.  *If you need a refill on your cardiac medications before your next appointment, please call your pharmacy*   Lab Work: None ordered today.   Testing/Procedures: Your EKG today showed normal sinus rhythm which is great!   Follow-Up: At [facility], you and your health needs are our priority.  As part of our continuing mission to provide you with exceptional heart care, we have created designated Provider Care Teams.  These Care Teams include your primary Cardiologist (physician) and Advanced Practice Providers (APPs -  Physician Assistants and Nurse Practitioners) who all work together to provide you with the care you need, when you need it.  We recommend signing up for the patient portal called "MyChart".  Sign up information is provided on this After Visit Summary.  MyChart is used to connect with patients for Virtual Visits (Telemedicine).  Patients are able to view lab/test results, encounter notes, upcoming appointments, etc.  Non-urgent messages can be sent to your provider as well.   To learn more about what you can do with MyChart, go to [email or web address].    Your next appointment:   1 year(s)  The format for your next appointment:   In Person  Provider:   Jodelle Red, MD or Gillian Shields, NP    Other Instructions  Alver Sorrow, NP will send a note to Dr. Magnus Ivan that you are clear for surgery.  Heart Healthy Diet Recommendations: A low-salt diet is recommended. Meats should be grilled, baked, or boiled. Avoid fried foods. Focus on lean protein sources like fish or chicken with vegetables and fruits. The American Heart Association is a Chief Technology Officer!  American Heart Association Diet and Lifeystyle Recommendations   Exercise recommendations: The American Heart Association recommends 150 minutes of moderate intensity exercise  weekly. Try 30 minutes of moderate intensity exercise 4-5 times per week. This could include walking, jogging, or swimming.   Important Information About Sugar

## 2022-06-06 ENCOUNTER — Telehealth (HOSPITAL_BASED_OUTPATIENT_CLINIC_OR_DEPARTMENT_OTHER): Payer: Self-pay | Admitting: Family

## 2022-06-06 NOTE — Telephone Encounter (Signed)
Left message for patient to call and schedule the Echocardiogram ordered by Caitlin Walker, NP 

## 2022-06-12 NOTE — Telephone Encounter (Signed)
Left message for patent to call and discuss scheduling the Echocardiogram ordered by Gillian Shields, NP

## 2022-06-20 NOTE — Telephone Encounter (Signed)
Spoke with patient to schedule the Echocardiogram ordered by Laurann Montana, NP--he wants [place] to call back on Tuesday 06/24/22 and he will discuss it then

## 2022-06-24 NOTE — Telephone Encounter (Signed)
Left message for patient to call and schedule the Echocardiogram ordered by Caitlin Walker, NP 

## 2022-07-01 ENCOUNTER — Encounter (HOSPITAL_BASED_OUTPATIENT_CLINIC_OR_DEPARTMENT_OTHER): Payer: Self-pay | Admitting: Family

## 2022-07-01 NOTE — Telephone Encounter (Signed)
Called to discuss scheduling the Echocardiogram ordered by Laurann Montana, NP---patient answered phone and them hung up.  Will mail letter requesting patient to call and schedule.

## 2023-05-11 ENCOUNTER — Ambulatory Visit (HOSPITAL_BASED_OUTPATIENT_CLINIC_OR_DEPARTMENT_OTHER): Payer: Commercial Managed Care - HMO | Admitting: Family

## 2023-08-03 NOTE — Progress Notes (Unsigned)
Cardiology Office Note:  .   Date:  08/04/2023  ID:  Sean Charles, DOB 05-28-1981, MRN [id number] PCP: Irven Coe, MD  [facility] Providers Cardiologist:  Jodelle Red, MD    History of Present Illness: Sean Charles Kitchen   Sean Charles is a 42 y.o. male  with a hx of hypertensive heart disease with heart failure, CVA (lacunar infarct), CKD2, obesity.   Admitted 04/2020 and diagnosed with acute combined systolic and diastolic heart failure. Echo EF 45-50%. Subsequently readmitted with lacunar infarct and hypertensive urgency. Established with Dr. Cristal Deer while admitted. Hospital follow up 05/2020 BP was better controlled in setting of less stressful job and exercising. He was recommended for echocardiogram and follow up in 3 months which was not completed.   Regimen at that time: Amlodipine 10mg  QD, Coreg 25mg  BID, Chlorthalidone 12.5mg  QD, Hydralazine 50mg  BID, Losartan 25mg  QD.  Last seen 06/04/22 and clearance provided for legt inguinal hernia repair. He had worked with PCP and was able to discontinue Amlodipine, Hydralazine, Carvedilol through lifestyle changes exercising at [facility] and heart healthy diet. Recommended for echo for monitoring of LVEF which was not performed.   He presents today for follow up.  Pleasant gentleman who lives with his wife and children who are 52 month old, 50 months old, and 43 years old. 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 Monitoring BP at home periodically with readings such as 125/85. Seeing nephrology annually with improvement in GFR to 80s leading to adjustment from CKD3 to CKD2. Endorses following heart healthy diet. Does not not as physically active with busy schedule and working to re-establish routine. Since last seen PCP has increased Chlorthalidone to 25mg  daily and added back Coreg 6.25mg  BID.  Labs via [facility]:  12/19/22 total cholesterol 150, LDL 90, HDL 43, triglycerides 88, ALT 36, AST 27, GFR 67,  09/2022 creatinine 1.62   ROS: Please see the history of  present illness.    All other systems reviewed and are negative.   Studies Reviewed: Sean Charles Kitchen   EKG Interpretation Date/Time:  Tuesday August 04 2023 09:20:15 EST Ventricular Rate:  65 PR Interval:  172 QRS Duration:  102 QT Interval:  404 QTC Calculation: 420 R Axis:   -5  Text Interpretation: Normal sinus rhythm Normal ECG Artifact noted in V1 rhythm strip, not of concern. Confirmed by Gillian Shields ([id number]) on 08/04/2023 9:32:36 AM    Cardiac Studies & Procedures       ECHOCARDIOGRAM  ECHOCARDIOGRAM COMPLETE 05/12/2020  Narrative ECHOCARDIOGRAM REPORT    Patient Name:   Sean Charles Date of Exam: 05/12/2020 Medical Rec #:  [id number]    Height:       70.0 in Accession #:    [id number]   Weight:       235.9 lb Date of Birth:  10/28/1980    BSA:          2.239 m Patient Age:    38 years     BP:           163/118 mmHg Patient Gender: M            HR:           94 bpm. Exam Location:  Inpatient  Procedure: 2D Echo, Cardiac Doppler and Color Doppler  Indications:    CHF-Acute Systolic 428.21 / I50.21  History:        Patient has no prior history of Echocardiogram examinations. Risk Factors:Former Smoker.  Sonographer:    Renella Cunas RDCS Referring Phys: [id number] Deno Lunger  [place]  IMPRESSIONS   1. Left ventricular ejection fraction, by estimation, is 45 to 50%. The left ventricle has mildly decreased function. The left ventricle has no regional wall motion abnormalities. There is moderate left ventricular hypertrophy. Left ventricular diastolic parameters are consistent with Grade I diastolic dysfunction (impaired relaxation). 2. Right ventricular systolic function is normal. The right ventricular size is normal. Tricuspid regurgitation signal is inadequate for assessing PA pressure. 3. The mitral valve is grossly normal. No evidence of mitral valve regurgitation. No evidence of mitral stenosis. 4. The aortic valve is tricuspid. Aortic valve regurgitation is not visualized.  No aortic stenosis is present. 5. Aortic dilatation noted. There is mild dilatation of the aortic root measuring 40 mm. 6. The inferior vena cava is normal in size with greater than 50% respiratory variability, suggesting right atrial pressure of 3 mmHg.  FINDINGS Left Ventricle: Left ventricular ejection fraction, by estimation, is 45 to 50%. The left ventricle has mildly decreased function. The left ventricle has no regional wall motion abnormalities. The left ventricular internal cavity size was normal in size. There is moderate left ventricular hypertrophy. Left ventricular diastolic parameters are consistent with Grade I diastolic dysfunction (impaired relaxation).  Right Ventricle: The right ventricular size is normal. No increase in right ventricular wall thickness. Right ventricular systolic function is normal. Tricuspid regurgitation signal is inadequate for assessing PA pressure.  Left Atrium: Left atrial size was normal in size.  Right Atrium: Right atrial size was normal in size.  Pericardium: A small pericardial effusion is present. The pericardial effusion is circumferential.  Mitral Valve: The mitral valve is grossly normal. No evidence of mitral valve regurgitation. No evidence of mitral valve stenosis.  Tricuspid Valve: The tricuspid valve is grossly normal. Tricuspid valve regurgitation is not demonstrated. No evidence of tricuspid stenosis.  Aortic Valve: The aortic valve is tricuspid. Aortic valve regurgitation is not visualized. No aortic stenosis is present.  Pulmonic Valve: The pulmonic valve was grossly normal. Pulmonic valve regurgitation is not visualized. No evidence of pulmonic stenosis.  Aorta: Aortic dilatation noted. There is mild dilatation of the aortic root measuring 40 mm.  Venous: The inferior vena cava is normal in size with greater than 50% respiratory variability, suggesting right atrial pressure of 3 mmHg.  IAS/Shunts: The atrial septum is grossly  normal.   LEFT VENTRICLE PLAX 2D LVIDd:         5.30 cm      Diastology LVIDs:         3.90 cm      LV e' lateral:   6.15 cm/s LV PW:         1.46 cm      LV E/e' lateral: 8.7 LV IVS:        1.33 cm      LV e' medial:    5.40 cm/s LVOT diam:     2.30 cm      LV E/e' medial:  10.0 LV SV:         50 LV SV Index:   22 LVOT Area:     4.15 cm  LV Volumes (MOD) LV vol d, MOD A2C: 153.0 ml LV vol d, MOD A4C: 149.0 ml LV vol s, MOD A2C: 79.7 ml LV vol s, MOD A4C: 79.6 ml LV SV MOD A2C:     73.3 ml LV SV MOD A4C:     149.0 ml LV SV MOD BP:      71.2 ml  RIGHT VENTRICLE RV S prime:  8.73 cm/s TAPSE (M-mode): 1.7 cm  LEFT ATRIUM             Index       RIGHT ATRIUM           Index LA diam:        4.30 cm 1.92 cm/m  RA Area:     13.10 cm LA Vol (A2C):   72.3 ml 32.29 ml/m RA Volume:   32.70 ml  14.60 ml/m LA Vol (A4C):   59.8 ml 26.71 ml/m LA Biplane Vol: 67.3 ml 30.06 ml/m AORTIC VALVE LVOT Vmax:   73.60 cm/s LVOT Vmean:  49.800 cm/s LVOT VTI:    0.120 m  AORTA Ao Root diam: 4.00 cm Ao Asc diam:  4.20 cm  MITRAL VALVE MV Area (PHT): 5.50 cm    SHUNTS MV Decel Time: 138 msec    Systemic VTI:  0.12 m MV E velocity: 53.80 cm/s  Systemic Diam: 2.30 cm MV A velocity: 49.40 cm/s MV E/A ratio:  1.09  Lennie Odor MD Electronically signed by Lennie Odor MD Signature Date/Time: 05/12/2020/3:39:36 PM    Final             Risk Assessment/Calculations:             Physical Exam:   VS:  BP 125/85 Comment: home BP  Pulse 75   Wt 230 lb 4.8 oz (104.5 kg)   SpO2 99%   BMI 34.01 kg/m    Wt Readings from Last 3 Encounters:  08/04/23 230 lb 4.8 oz (104.5 kg)  06/04/22 236 lb (107 kg)  06/26/20 224 lb 6.4 oz (101.8 kg)    Vitals:   08/04/23 0911 08/04/23 0915  BP: (!) 128/98 125/85 Comment: home BP  Pulse: 75   Weight: 230 lb 4.8 oz (104.5 kg)   SpO2: 99%      GEN: Well nourished, well developed in no acute distress NECK: No JVD; No carotid  bruits CARDIAC: RRR, no murmurs, rubs, gallops RESPIRATORY:  Clear to auscultation without rales, wheezing or rhonchi  ABDOMEN: Soft, non-tender, non-distended EXTREMITIES:  No edema; No deformity   ASSESSMENT AND PLAN: .    Hypertensive heart disease with heart failure - Euvolemic and well compensated on exam. 04/2020 EF 45-50% in setting of hypertensive crisis. 11/2020 renal duplex with no stenosis.  Discussed updating echo to reassess LVEF, as feeling wella nd euvolemic he prefers to defer. BP mildly elevated in clinic 128/98 but routinely 125/85 or similar at home. No indication for loop diuretic. Continue Coreg 6.25mg  BID, Chlorthalidone 25mg  daily, Losartan 25mg  daily.   Hx of CVA - Continue Aspirin, Atorvastatin.   CKD2 - Careful titration of diuretic and antihypertensive. Follows with nephrology.  HLD, LDL goal <70 - Continue Atorvastatin. 12/19/22 total cholesterol 150, LDL 90, HDL 43, triglycerides 88, ALT 36, AST 27. LDL in 2023 was 73. Endorses less exercise, plans to make lifestyle changes prior to repeat lipids with PCP 11/2022.       Dispo: follow up in 1 year  Signed, Alver Sorrow, NP

## 2023-08-04 ENCOUNTER — Encounter (HOSPITAL_BASED_OUTPATIENT_CLINIC_OR_DEPARTMENT_OTHER): Payer: Self-pay | Admitting: Family

## 2023-08-04 ENCOUNTER — Ambulatory Visit (HOSPITAL_BASED_OUTPATIENT_CLINIC_OR_DEPARTMENT_OTHER): Payer: 59 | Admitting: Family

## 2023-08-04 VITALS — BP 125/85 | HR 75 | Ht 69.0 in | Wt 230.3 lb

## 2023-08-04 DIAGNOSIS — Z8673 Personal history of transient ischemic attack (TIA), and cerebral infarction without residual deficits: Secondary | ICD-10-CM

## 2023-08-04 DIAGNOSIS — E785 Hyperlipidemia, unspecified: Secondary | ICD-10-CM

## 2023-08-04 DIAGNOSIS — I10 Essential (primary) hypertension: Secondary | ICD-10-CM

## 2023-08-04 NOTE — Patient Instructions (Signed)
Medication Instructions:  Continue your current medications.   *If you need a refill on your cardiac medications before your next appointment, please call your pharmacy*  Follow-Up: At [facility], you and your health needs are our priority.  As part of our continuing mission to provide you with exceptional heart care, we have created designated Provider Care Teams.  These Care Teams include your primary Cardiologist (physician) and Advanced Practice Providers (APPs -  Physician Assistants and Nurse Practitioners) who all work together to provide you with the care you need, when you need it.  We recommend signing up for the patient portal called "MyChart".  Sign up information is provided on this After Visit Summary.  MyChart is used to connect with patients for Virtual Visits (Telemedicine).  Patients are able to view lab/test results, encounter notes, upcoming appointments, etc.  Non-urgent messages can be sent to your provider as well.   To learn more about what you can do with MyChart, go to [email or web address].    Your next appointment:   1 year(s)  Provider:   Jodelle Red, MD or Gillian Shields, NP    Other Instructions  Heart Healthy Diet Recommendations: A low-salt diet is recommended. Meats should be grilled, baked, or boiled. Avoid fried foods. Focus on lean protein sources like fish or chicken with vegetables and fruits. The American [employer] Association is a Chief Technology Officer!  American Heart Association Diet and Lifeystyle Recommendations   Exercise recommendations: The American Heart Association recommends 150 minutes of moderate intensity exercise weekly. Try 30 minutes of moderate intensity exercise 4-5 times per week. This could include walking, jogging, or swimming.

## 2023-10-08 DIAGNOSIS — E785 Hyperlipidemia, unspecified: Secondary | ICD-10-CM | POA: Diagnosis not present

## 2023-10-08 DIAGNOSIS — F909 Attention-deficit hyperactivity disorder, unspecified type: Secondary | ICD-10-CM | POA: Diagnosis not present

## 2023-10-08 DIAGNOSIS — I10 Essential (primary) hypertension: Secondary | ICD-10-CM | POA: Diagnosis not present

## 2023-10-08 DIAGNOSIS — N189 Chronic kidney disease, unspecified: Secondary | ICD-10-CM | POA: Diagnosis not present

## 2023-11-19 DIAGNOSIS — N182 Chronic kidney disease, stage 2 (mild): Secondary | ICD-10-CM | POA: Diagnosis not present

## 2023-11-20 DIAGNOSIS — N182 Chronic kidney disease, stage 2 (mild): Secondary | ICD-10-CM | POA: Diagnosis not present

## 2023-11-24 DIAGNOSIS — N182 Chronic kidney disease, stage 2 (mild): Secondary | ICD-10-CM | POA: Diagnosis not present

## 2023-11-24 DIAGNOSIS — F988 Other specified behavioral and emotional disorders with onset usually occurring in childhood and adolescence: Secondary | ICD-10-CM | POA: Diagnosis not present

## 2023-11-24 DIAGNOSIS — I129 Hypertensive chronic kidney disease with stage 1 through stage 4 chronic kidney disease, or unspecified chronic kidney disease: Secondary | ICD-10-CM | POA: Diagnosis not present

## 2023-11-24 DIAGNOSIS — I5042 Chronic combined systolic (congestive) and diastolic (congestive) heart failure: Secondary | ICD-10-CM | POA: Diagnosis not present
